# Patient Record
Sex: Female | Born: 1970 | Race: White | Hispanic: No | Marital: Married | Smoking: Never smoker
Health system: Southern US, Community
[De-identification: ages and names within clinical notes are randomized; demographics above are authoritative.]

## PROBLEM LIST (undated history)

## (undated) DIAGNOSIS — G4700 Insomnia, unspecified: Secondary | ICD-10-CM

## (undated) DIAGNOSIS — H53003 Unspecified amblyopia, bilateral: Secondary | ICD-10-CM

## (undated) DIAGNOSIS — R011 Cardiac murmur, unspecified: Secondary | ICD-10-CM

## (undated) DIAGNOSIS — Q231 Congenital insufficiency of aortic valve: Secondary | ICD-10-CM

## (undated) DIAGNOSIS — N979 Female infertility, unspecified: Secondary | ICD-10-CM

## (undated) DIAGNOSIS — F419 Anxiety disorder, unspecified: Secondary | ICD-10-CM

## (undated) DIAGNOSIS — R002 Palpitations: Secondary | ICD-10-CM

## (undated) DIAGNOSIS — Z5181 Encounter for therapeutic drug level monitoring: Secondary | ICD-10-CM

## (undated) HISTORY — DX: Anxiety disorder, unspecified: F41.9

## (undated) HISTORY — DX: Congenital insufficiency of aortic valve: Q23.1

## (undated) HISTORY — DX: Insomnia, unspecified: G47.00

## (undated) HISTORY — DX: Palpitations: R00.2

## (undated) HISTORY — DX: Cardiac murmur, unspecified: R01.1

## (undated) HISTORY — DX: Encounter for therapeutic drug level monitoring: Z51.81

## (undated) HISTORY — DX: Female infertility, unspecified: N97.9

## (undated) HISTORY — DX: Unspecified amblyopia, bilateral: H53.003

---
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
## 1990-12-31 HISTORY — PX: WISDOM TOOTH EXTRACTION: SHX21

## 1998-10-26 ENCOUNTER — Encounter: Payer: Self-pay | Admitting: Obstetrics and Gynecology

## 1998-10-26 ENCOUNTER — Ambulatory Visit (HOSPITAL_COMMUNITY): Admission: RE | Admit: 1998-10-26 | Discharge: 1998-10-26 | Payer: Self-pay

## 1999-04-27 ENCOUNTER — Inpatient Hospital Stay (HOSPITAL_COMMUNITY): Admission: EM | Admit: 1999-04-27 | Discharge: 1999-05-01 | Payer: Self-pay | Admitting: Psychiatry

## 1999-05-02 ENCOUNTER — Encounter (HOSPITAL_COMMUNITY): Admission: RE | Admit: 1999-05-02 | Discharge: 1999-05-12 | Payer: Self-pay | Admitting: Psychiatry

## 2000-06-12 ENCOUNTER — Other Ambulatory Visit: Admission: RE | Admit: 2000-06-12 | Discharge: 2000-06-12 | Payer: Self-pay | Admitting: Obstetrics and Gynecology

## 2000-07-23 ENCOUNTER — Encounter: Admission: RE | Admit: 2000-07-23 | Discharge: 2000-10-21 | Payer: Self-pay | Admitting: *Deleted

## 2002-03-16 ENCOUNTER — Ambulatory Visit (HOSPITAL_COMMUNITY): Admission: RE | Admit: 2002-03-16 | Discharge: 2002-03-16 | Payer: Self-pay | Admitting: Internal Medicine

## 2002-03-16 ENCOUNTER — Encounter: Payer: Self-pay | Admitting: Internal Medicine

## 2005-11-19 ENCOUNTER — Other Ambulatory Visit: Admission: RE | Admit: 2005-11-19 | Discharge: 2005-11-19 | Payer: Self-pay | Admitting: Obstetrics and Gynecology

## 2010-01-01 ENCOUNTER — Emergency Department (HOSPITAL_COMMUNITY): Admission: EM | Admit: 2010-01-01 | Discharge: 2010-01-01 | Payer: Self-pay | Admitting: Family Medicine

## 2010-01-01 ENCOUNTER — Emergency Department (HOSPITAL_COMMUNITY): Admission: EM | Admit: 2010-01-01 | Discharge: 2010-01-01 | Payer: Self-pay | Admitting: Emergency Medicine

## 2010-05-15 IMAGING — CR DG CHEST 2V
2 series · 2 of 2 positions shown · non-contrast
Comparison: None

CLINICAL DATA: Right-sided posterior chest pain for 2 days.  No
injury.

CHEST - 2 VIEW

[view not recorded (1 of 2)]
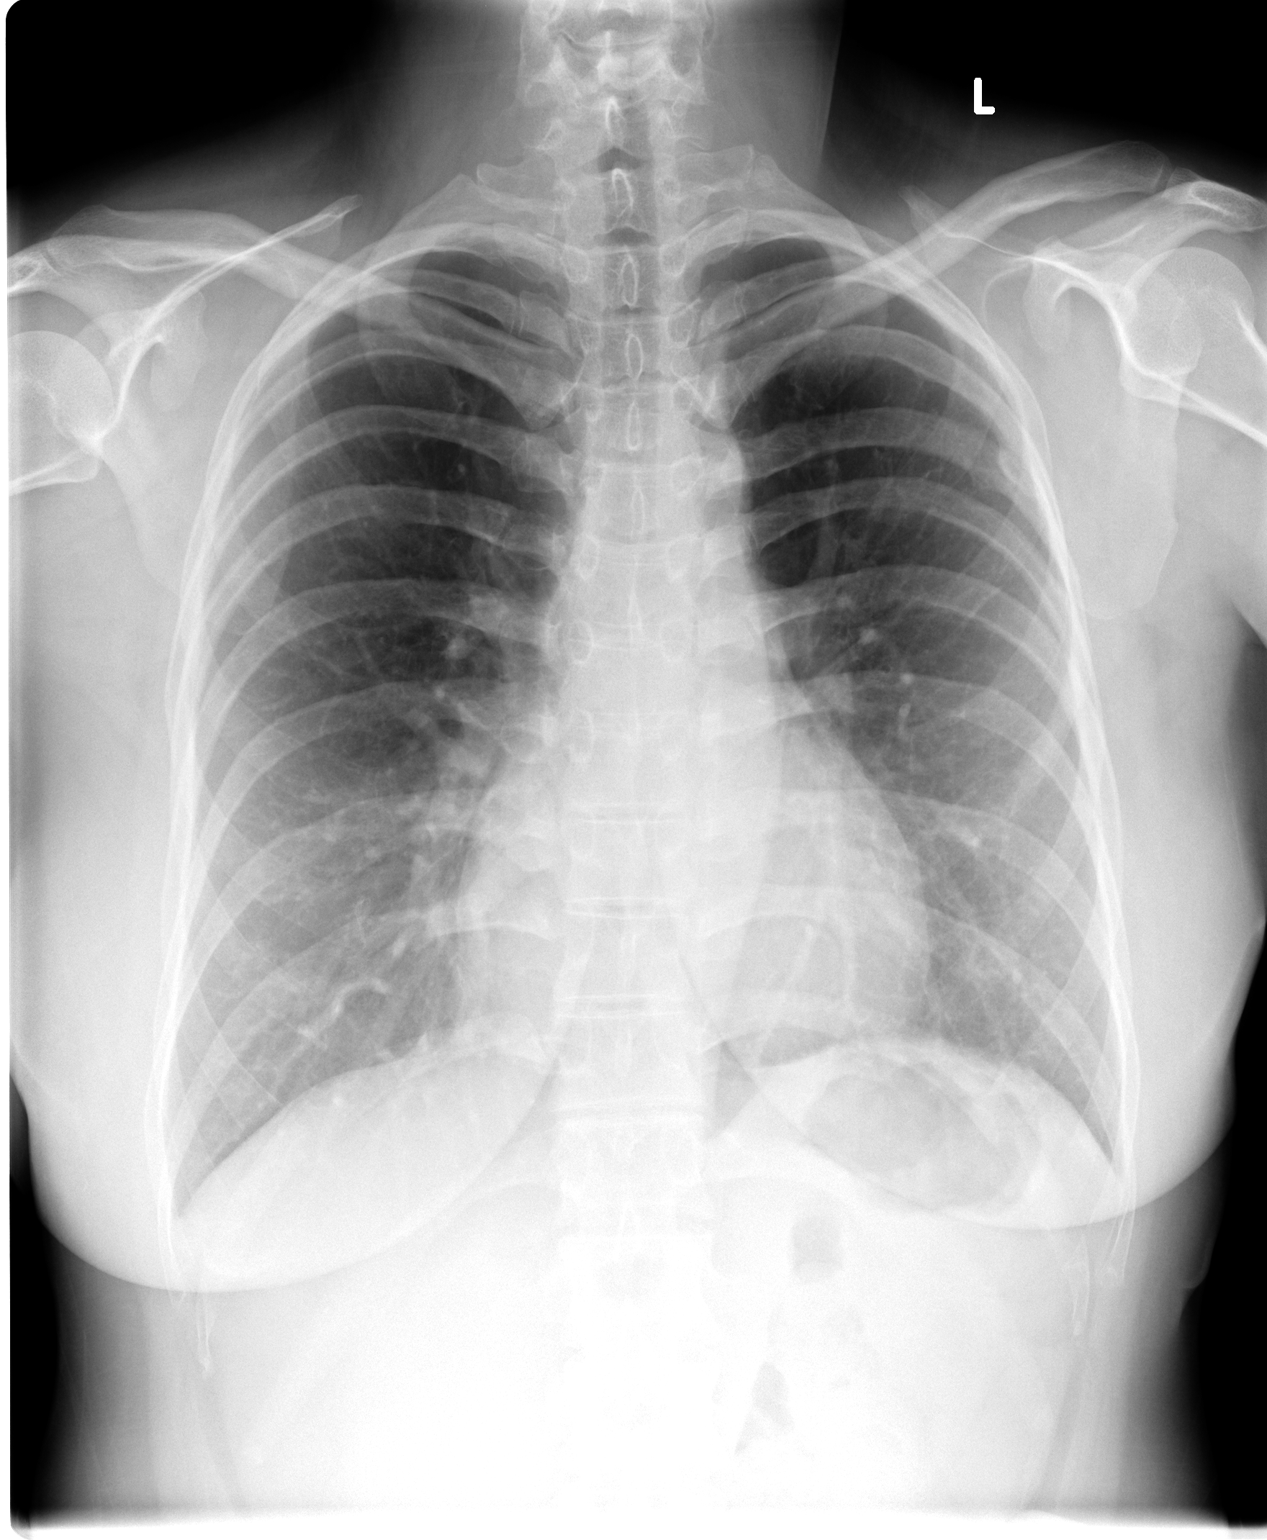

[view not recorded (2 of 2)]
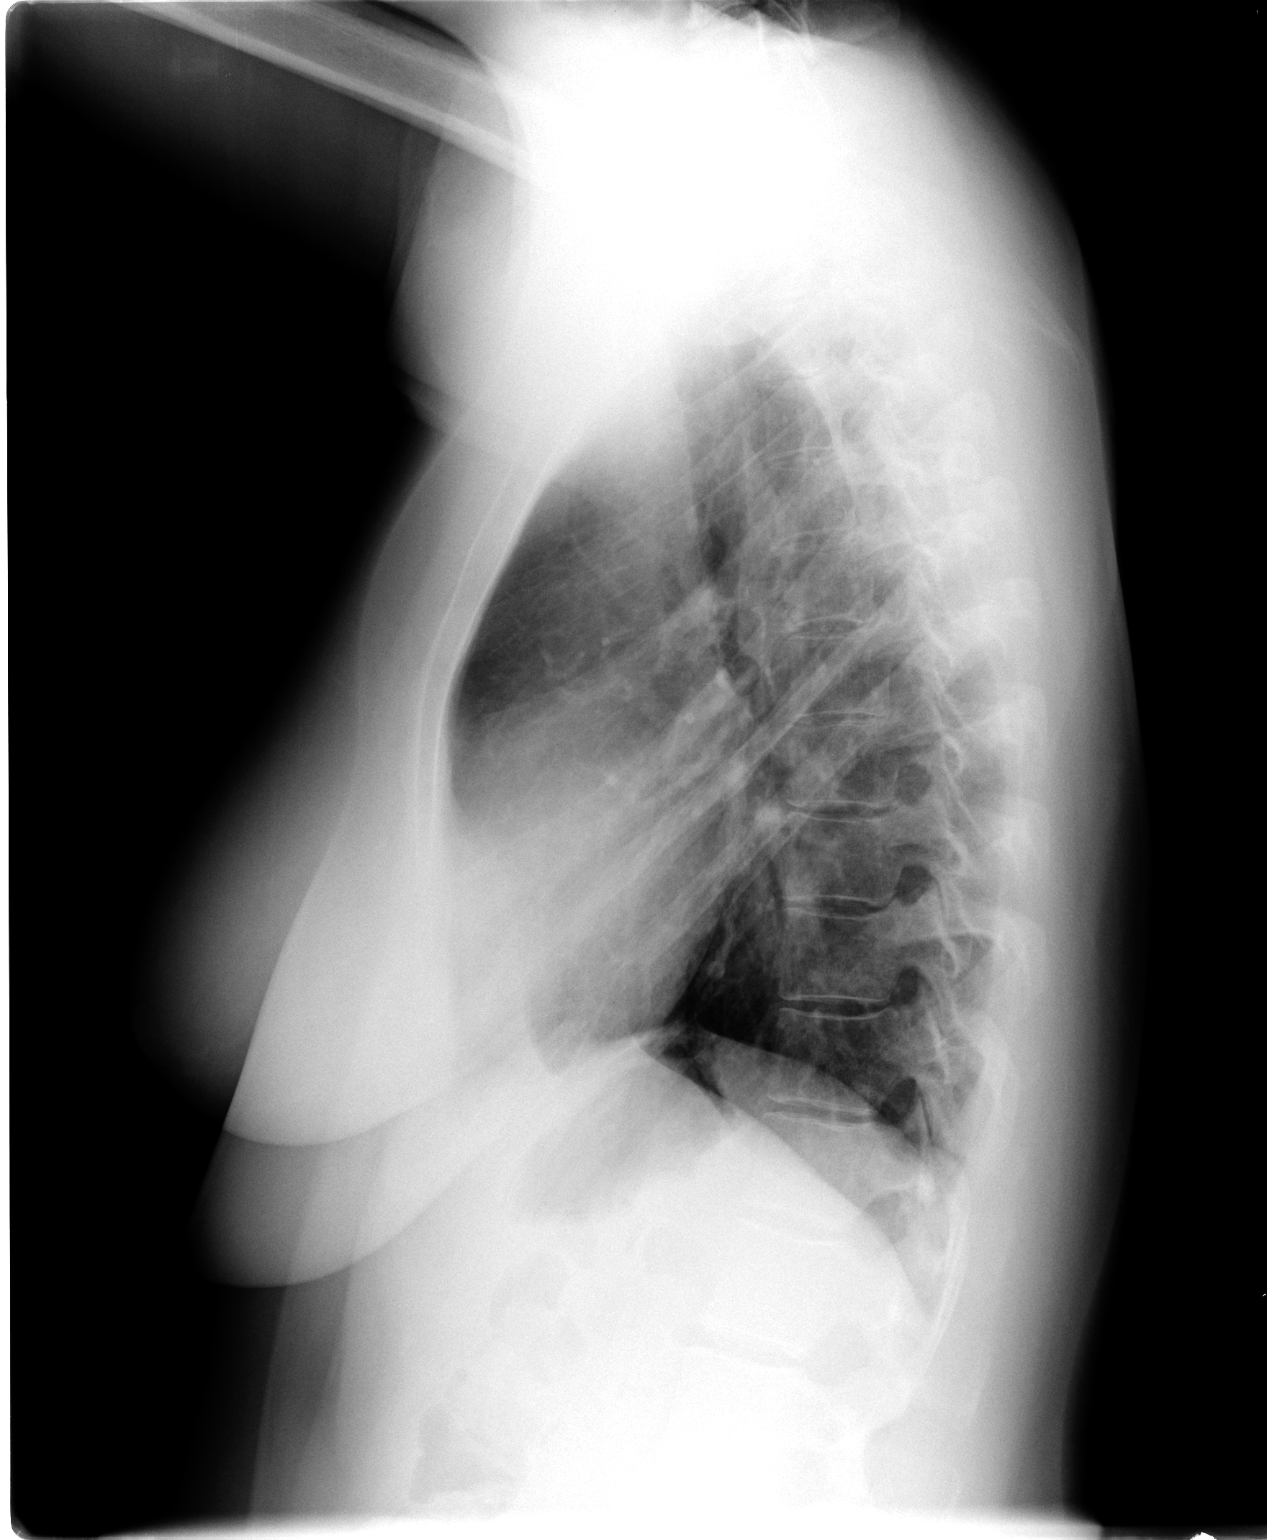

[2 of 2 positions shown; findings below may reference images not displayed]

FINDINGS: Mild pectus excavatum deformity. Midline trachea.  Normal
heart size and mediastinal contours. No pleural effusion or
pneumothorax.  Remote left-sided rib trauma at the fourth and
likely 5th posterolateral ribs.
IMPRESSION: No acute cardiopulmonary disease.

## 2011-03-18 LAB — POCT CARDIAC MARKERS
CKMB, poc: <1 ng/mL — ABNORMAL LOW (ref 1.0–8.0)
Troponin i, poc: <0.05 ng/mL (ref 0.00–0.09)

## 2011-03-18 LAB — URINALYSIS, ROUTINE W REFLEX MICROSCOPIC
Glucose, UA: NEGATIVE mg/dL
Hgb urine dipstick: NEGATIVE
Ketones, ur: 40 mg/dL — AB
Nitrite: NEGATIVE
Protein, ur: NEGATIVE mg/dL
Specific Gravity, Urine: 1.025 (ref 1.005–1.030)
Urobilinogen, UA: 1 mg/dL (ref 0.0–1.0)
pH: 5.5 (ref 5.0–8.0)

## 2011-03-18 LAB — POCT I-STAT, CHEM 8
Calcium, Ion: 1.16 mmol/L (ref 1.12–1.32)
Chloride: 105 mEq/L (ref 96–112)
Creatinine, Ser: 0.5 mg/dL (ref 0.4–1.2)
Glucose, Bld: 88 mg/dL (ref 70–99)
HCT: 42 % (ref 36.0–46.0)
Potassium: 3.5 mEq/L (ref 3.5–5.1)

## 2011-03-18 LAB — D-DIMER, QUANTITATIVE: D-Dimer, Quant: <0.22 ug/mL-FEU (ref 0.00–0.48)

## 2014-09-21 ENCOUNTER — Ambulatory Visit: Payer: Self-pay

## 2014-10-04 ENCOUNTER — Ambulatory Visit: Payer: Self-pay | Attending: Orthopaedic Surgery

## 2016-05-21 DIAGNOSIS — Z6828 Body mass index (BMI) 28.0-28.9, adult: Secondary | ICD-10-CM | POA: Diagnosis not present

## 2016-05-21 DIAGNOSIS — W57XXXA Bitten or stung by nonvenomous insect and other nonvenomous arthropods, initial encounter: Secondary | ICD-10-CM | POA: Diagnosis not present

## 2016-05-21 DIAGNOSIS — H60502 Unspecified acute noninfective otitis externa, left ear: Secondary | ICD-10-CM | POA: Diagnosis not present

## 2016-06-11 DIAGNOSIS — F4323 Adjustment disorder with mixed anxiety and depressed mood: Secondary | ICD-10-CM | POA: Diagnosis not present

## 2016-07-13 DIAGNOSIS — Z6827 Body mass index (BMI) 27.0-27.9, adult: Secondary | ICD-10-CM | POA: Diagnosis not present

## 2016-07-13 DIAGNOSIS — H00016 Hordeolum externum left eye, unspecified eyelid: Secondary | ICD-10-CM | POA: Diagnosis not present

## 2016-07-13 DIAGNOSIS — H9193 Unspecified hearing loss, bilateral: Secondary | ICD-10-CM | POA: Diagnosis not present

## 2016-08-07 DIAGNOSIS — H93293 Other abnormal auditory perceptions, bilateral: Secondary | ICD-10-CM | POA: Diagnosis not present

## 2016-10-17 DIAGNOSIS — Z6827 Body mass index (BMI) 27.0-27.9, adult: Secondary | ICD-10-CM | POA: Diagnosis not present

## 2016-10-17 DIAGNOSIS — M2550 Pain in unspecified joint: Secondary | ICD-10-CM | POA: Diagnosis not present

## 2016-11-29 DIAGNOSIS — F4323 Adjustment disorder with mixed anxiety and depressed mood: Secondary | ICD-10-CM | POA: Diagnosis not present

## 2017-01-10 DIAGNOSIS — Z6828 Body mass index (BMI) 28.0-28.9, adult: Secondary | ICD-10-CM | POA: Diagnosis not present

## 2017-01-10 DIAGNOSIS — R05 Cough: Secondary | ICD-10-CM | POA: Diagnosis not present

## 2017-01-10 DIAGNOSIS — J09X2 Influenza due to identified novel influenza A virus with other respiratory manifestations: Secondary | ICD-10-CM | POA: Diagnosis not present

## 2017-01-10 DIAGNOSIS — R6883 Chills (without fever): Secondary | ICD-10-CM | POA: Diagnosis not present

## 2017-03-08 DIAGNOSIS — F4323 Adjustment disorder with mixed anxiety and depressed mood: Secondary | ICD-10-CM | POA: Diagnosis not present

## 2017-05-08 DIAGNOSIS — H6692 Otitis media, unspecified, left ear: Secondary | ICD-10-CM | POA: Diagnosis not present

## 2017-05-08 DIAGNOSIS — Z6828 Body mass index (BMI) 28.0-28.9, adult: Secondary | ICD-10-CM | POA: Diagnosis not present

## 2017-05-08 DIAGNOSIS — L258 Unspecified contact dermatitis due to other agents: Secondary | ICD-10-CM | POA: Diagnosis not present

## 2017-06-28 DIAGNOSIS — Z1231 Encounter for screening mammogram for malignant neoplasm of breast: Secondary | ICD-10-CM | POA: Diagnosis not present

## 2017-06-28 DIAGNOSIS — Z01419 Encounter for gynecological examination (general) (routine) without abnormal findings: Secondary | ICD-10-CM | POA: Diagnosis not present

## 2017-06-28 DIAGNOSIS — Z6829 Body mass index (BMI) 29.0-29.9, adult: Secondary | ICD-10-CM | POA: Diagnosis not present

## 2017-06-28 DIAGNOSIS — Z0000 Encounter for general adult medical examination without abnormal findings: Secondary | ICD-10-CM | POA: Diagnosis not present

## 2017-07-05 DIAGNOSIS — M791 Myalgia: Secondary | ICD-10-CM | POA: Diagnosis not present

## 2017-07-05 DIAGNOSIS — Z0000 Encounter for general adult medical examination without abnormal findings: Secondary | ICD-10-CM | POA: Diagnosis not present

## 2017-07-05 DIAGNOSIS — G5600 Carpal tunnel syndrome, unspecified upper limb: Secondary | ICD-10-CM | POA: Diagnosis not present

## 2017-07-05 DIAGNOSIS — M79673 Pain in unspecified foot: Secondary | ICD-10-CM | POA: Diagnosis not present

## 2017-07-05 DIAGNOSIS — Q231 Congenital insufficiency of aortic valve: Secondary | ICD-10-CM | POA: Diagnosis not present

## 2017-07-05 DIAGNOSIS — Z1389 Encounter for screening for other disorder: Secondary | ICD-10-CM | POA: Diagnosis not present

## 2018-01-16 DIAGNOSIS — J0100 Acute maxillary sinusitis, unspecified: Secondary | ICD-10-CM | POA: Diagnosis not present

## 2018-01-16 DIAGNOSIS — J029 Acute pharyngitis, unspecified: Secondary | ICD-10-CM | POA: Diagnosis not present

## 2018-01-16 DIAGNOSIS — B349 Viral infection, unspecified: Secondary | ICD-10-CM | POA: Diagnosis not present

## 2018-03-11 DIAGNOSIS — J0100 Acute maxillary sinusitis, unspecified: Secondary | ICD-10-CM | POA: Diagnosis not present

## 2018-05-01 DIAGNOSIS — M79672 Pain in left foot: Secondary | ICD-10-CM | POA: Diagnosis not present

## 2018-05-01 DIAGNOSIS — Z6829 Body mass index (BMI) 29.0-29.9, adult: Secondary | ICD-10-CM | POA: Diagnosis not present

## 2018-07-23 DIAGNOSIS — Z6829 Body mass index (BMI) 29.0-29.9, adult: Secondary | ICD-10-CM | POA: Diagnosis not present

## 2018-07-23 DIAGNOSIS — Z1231 Encounter for screening mammogram for malignant neoplasm of breast: Secondary | ICD-10-CM | POA: Diagnosis not present

## 2018-07-23 DIAGNOSIS — Z01419 Encounter for gynecological examination (general) (routine) without abnormal findings: Secondary | ICD-10-CM | POA: Diagnosis not present

## 2018-10-10 ENCOUNTER — Other Ambulatory Visit (HOSPITAL_COMMUNITY): Payer: Self-pay | Admitting: Internal Medicine

## 2018-10-10 DIAGNOSIS — Q231 Congenital insufficiency of aortic valve: Secondary | ICD-10-CM

## 2018-10-10 DIAGNOSIS — M79672 Pain in left foot: Secondary | ICD-10-CM | POA: Diagnosis not present

## 2018-10-10 DIAGNOSIS — Z0000 Encounter for general adult medical examination without abnormal findings: Secondary | ICD-10-CM | POA: Diagnosis not present

## 2018-10-10 DIAGNOSIS — Z1389 Encounter for screening for other disorder: Secondary | ICD-10-CM | POA: Diagnosis not present

## 2018-10-23 ENCOUNTER — Other Ambulatory Visit: Payer: Self-pay

## 2018-10-23 ENCOUNTER — Ambulatory Visit (HOSPITAL_COMMUNITY): Payer: BLUE CROSS/BLUE SHIELD | Attending: Internal Medicine

## 2018-10-23 DIAGNOSIS — Q231 Congenital insufficiency of aortic valve: Secondary | ICD-10-CM

## 2019-09-04 DIAGNOSIS — Z01419 Encounter for gynecological examination (general) (routine) without abnormal findings: Secondary | ICD-10-CM | POA: Diagnosis not present

## 2019-09-04 DIAGNOSIS — Z6830 Body mass index (BMI) 30.0-30.9, adult: Secondary | ICD-10-CM | POA: Diagnosis not present

## 2019-09-04 DIAGNOSIS — Z1231 Encounter for screening mammogram for malignant neoplasm of breast: Secondary | ICD-10-CM | POA: Diagnosis not present

## 2019-09-04 DIAGNOSIS — N841 Polyp of cervix uteri: Secondary | ICD-10-CM | POA: Diagnosis not present

## 2019-09-04 DIAGNOSIS — Z309 Encounter for contraceptive management, unspecified: Secondary | ICD-10-CM | POA: Diagnosis not present

## 2019-12-02 DIAGNOSIS — S90861A Insect bite (nonvenomous), right foot, initial encounter: Secondary | ICD-10-CM | POA: Diagnosis not present

## 2019-12-02 DIAGNOSIS — L988 Other specified disorders of the skin and subcutaneous tissue: Secondary | ICD-10-CM | POA: Diagnosis not present

## 2020-01-29 DIAGNOSIS — J019 Acute sinusitis, unspecified: Secondary | ICD-10-CM | POA: Diagnosis not present

## 2020-01-29 DIAGNOSIS — R509 Fever, unspecified: Secondary | ICD-10-CM | POA: Diagnosis not present

## 2020-01-29 DIAGNOSIS — J3489 Other specified disorders of nose and nasal sinuses: Secondary | ICD-10-CM | POA: Diagnosis not present

## 2020-01-29 DIAGNOSIS — Z1152 Encounter for screening for COVID-19: Secondary | ICD-10-CM | POA: Diagnosis not present

## 2020-02-17 DIAGNOSIS — L820 Inflamed seborrheic keratosis: Secondary | ICD-10-CM | POA: Diagnosis not present

## 2020-02-17 DIAGNOSIS — L821 Other seborrheic keratosis: Secondary | ICD-10-CM | POA: Diagnosis not present

## 2020-02-17 DIAGNOSIS — D2262 Melanocytic nevi of left upper limb, including shoulder: Secondary | ICD-10-CM | POA: Diagnosis not present

## 2020-02-17 DIAGNOSIS — D225 Melanocytic nevi of trunk: Secondary | ICD-10-CM | POA: Diagnosis not present

## 2020-03-05 ENCOUNTER — Ambulatory Visit: Payer: BC Managed Care – PPO | Attending: Internal Medicine

## 2020-07-22 DIAGNOSIS — Z0000 Encounter for general adult medical examination without abnormal findings: Secondary | ICD-10-CM | POA: Diagnosis not present

## 2020-07-22 DIAGNOSIS — Z79899 Other long term (current) drug therapy: Secondary | ICD-10-CM | POA: Diagnosis not present

## 2020-08-02 DIAGNOSIS — Z0000 Encounter for general adult medical examination without abnormal findings: Secondary | ICD-10-CM | POA: Diagnosis not present

## 2020-08-02 DIAGNOSIS — R82998 Other abnormal findings in urine: Secondary | ICD-10-CM | POA: Diagnosis not present

## 2020-08-02 DIAGNOSIS — Q231 Congenital insufficiency of aortic valve: Secondary | ICD-10-CM | POA: Diagnosis not present

## 2020-08-02 DIAGNOSIS — E669 Obesity, unspecified: Secondary | ICD-10-CM | POA: Diagnosis not present

## 2020-08-04 DIAGNOSIS — Z1212 Encounter for screening for malignant neoplasm of rectum: Secondary | ICD-10-CM | POA: Diagnosis not present

## 2020-11-11 DIAGNOSIS — R350 Frequency of micturition: Secondary | ICD-10-CM | POA: Diagnosis not present

## 2020-12-13 DIAGNOSIS — Z6830 Body mass index (BMI) 30.0-30.9, adult: Secondary | ICD-10-CM | POA: Diagnosis not present

## 2020-12-13 DIAGNOSIS — Z1231 Encounter for screening mammogram for malignant neoplasm of breast: Secondary | ICD-10-CM | POA: Diagnosis not present

## 2020-12-13 DIAGNOSIS — Z01419 Encounter for gynecological examination (general) (routine) without abnormal findings: Secondary | ICD-10-CM | POA: Diagnosis not present

## 2021-04-19 DIAGNOSIS — Z23 Encounter for immunization: Secondary | ICD-10-CM | POA: Diagnosis not present

## 2021-06-16 DIAGNOSIS — D692 Other nonthrombocytopenic purpura: Secondary | ICD-10-CM | POA: Diagnosis not present

## 2021-06-16 DIAGNOSIS — R0602 Shortness of breath: Secondary | ICD-10-CM | POA: Diagnosis not present

## 2021-08-11 DIAGNOSIS — E669 Obesity, unspecified: Secondary | ICD-10-CM | POA: Diagnosis not present

## 2021-08-11 DIAGNOSIS — Z0000 Encounter for general adult medical examination without abnormal findings: Secondary | ICD-10-CM | POA: Diagnosis not present

## 2021-08-17 DIAGNOSIS — R7301 Impaired fasting glucose: Secondary | ICD-10-CM | POA: Diagnosis not present

## 2021-08-17 DIAGNOSIS — Z1389 Encounter for screening for other disorder: Secondary | ICD-10-CM | POA: Diagnosis not present

## 2021-08-17 DIAGNOSIS — R82998 Other abnormal findings in urine: Secondary | ICD-10-CM | POA: Diagnosis not present

## 2021-08-17 DIAGNOSIS — Z0000 Encounter for general adult medical examination without abnormal findings: Secondary | ICD-10-CM | POA: Diagnosis not present

## 2021-08-17 DIAGNOSIS — Z1331 Encounter for screening for depression: Secondary | ICD-10-CM | POA: Diagnosis not present

## 2021-11-22 ENCOUNTER — Encounter: Payer: Self-pay | Admitting: Gastroenterology

## 2021-12-31 HISTORY — PX: OTHER SURGICAL HISTORY: SHX169

## 2022-01-10 DIAGNOSIS — H168 Other keratitis: Secondary | ICD-10-CM | POA: Diagnosis not present

## 2022-01-11 ENCOUNTER — Other Ambulatory Visit: Payer: Self-pay

## 2022-01-11 ENCOUNTER — Ambulatory Visit (AMBULATORY_SURGERY_CENTER): Payer: BC Managed Care – PPO

## 2022-01-11 VITALS — Ht 60.0 in | Wt 165.0 lb

## 2022-01-11 DIAGNOSIS — Z1211 Encounter for screening for malignant neoplasm of colon: Secondary | ICD-10-CM

## 2022-01-11 MED ORDER — PLENVU 140 G PO SOLR: 1.0000 | ORAL | 0 refills | Status: DC

## 2022-01-11 NOTE — Progress Notes (Signed)
No egg or soy allergy known to patient  No issues known to pt with past sedation with any surgeries or procedures Patient denies ever being told they had issues or difficulty with intubation  No FH of Malignant Hyperthermia Pt is not on diet pills Pt is not on home 02  Pt is not on blood thinners  Pt denies issues with constipation at this time "not very often"  No A fib or A flutter Pt is fully vaccinated for Covid x 2; Coupon to pt in PV today, Code to Pharmacy and NO PA's for preps discussed with pt in PV today  Discussed with pt there will be an out-of-pocket cost for prep and that varies from $0 to 70 + dollars - pt verbalized understanding  Due to the COVID-19 pandemic we are asking patients to follow certain guidelines in PV and the [facility]   Pt aware of COVID protocols and [facility] guidelines  PV completed over the phone. Pt verified name, DOB, address and insurance during PV today.  Pt mailed instruction packet with copy of consent form to read and not return, and instructions.  Pt encouraged to call with questions or issues.  If pt has My chart, procedure instructions sent via My Chart   Patient given a choice between suprep and plenvu preps- patient chose plenvu and understands this will be an out of pocket expense even with the coupon;

## 2022-01-17 DIAGNOSIS — H168 Other keratitis: Secondary | ICD-10-CM | POA: Diagnosis not present

## 2022-01-25 ENCOUNTER — Encounter: Payer: BC Managed Care – PPO | Admitting: Gastroenterology

## 2022-03-01 ENCOUNTER — Encounter: Payer: Self-pay | Admitting: Gastroenterology

## 2022-03-07 ENCOUNTER — Encounter: Payer: Self-pay | Admitting: Gastroenterology

## 2022-03-07 ENCOUNTER — Ambulatory Visit (AMBULATORY_SURGERY_CENTER): Payer: BC Managed Care – PPO | Admitting: Gastroenterology

## 2022-03-07 VITALS — BP 154/77 | HR 64 | Temp 97.1°F | Resp 14 | Ht 60.0 in | Wt 165.0 lb

## 2022-03-07 DIAGNOSIS — Z1211 Encounter for screening for malignant neoplasm of colon: Secondary | ICD-10-CM | POA: Diagnosis not present

## 2022-03-07 MED ORDER — SODIUM CHLORIDE 0.9 % IV SOLN: 500.0000 mL | Freq: Once | INTRAVENOUS | Status: DC

## 2022-03-07 NOTE — Progress Notes (Signed)
[facility] History and Physical ? ? ?Primary Care Physician:  Cleatis Polka., MD ? ? ?Reason for Procedure:  Colorectal cancer screening ? ?Plan:    Screening colonoscopy with possible interventions as needed ? ? ? ? ?HPI: Kyaira Trantham is a very pleasant 51 y.o. female here for screening colonoscopy. ?Denies any nausea, vomiting, abdominal pain, melena or bright red blood per rectum ? ?The risks and benefits as well as alternatives of endoscopic procedure(s) have been discussed and reviewed. All questions answered. The patient agrees to proceed. ? ? ? ?Past Medical History:  ?Diagnosis Date  ? Blood transfusion without reported diagnosis 1976  ? Heart murmur   ? as a child  ? ? ?Past Surgical History:  ?Procedure Laterality Date  ? CARDIAC SURGERY  1976  ? CESAREAN SECTION  1994  ? tooth removal  12/2021  ? WISDOM TOOTH EXTRACTION  1992  ? ? ?Prior to Admission medications   ?Medication Sig Start Date End Date Taking? Authorizing Provider  ?CHARLOTTE 24 FE 1-20 MG-MCG(24) CHEW Chew 1 tablet by mouth daily. 12/22/21  Yes [provider]  ?neomycin-polymyxin b-dexamethasone (MAXITROL) 3.5-10000-0.1 SUSP Place 1 drop into the left eye 3 (three) times daily. 01/10/22   [provider]  ? ? ?Current Outpatient Medications  ?Medication Sig Dispense Refill  ? CHARLOTTE 24 FE 1-20 MG-MCG(24) CHEW Chew 1 tablet by mouth daily.    ? neomycin-polymyxin b-dexamethasone (MAXITROL) 3.5-10000-0.1 SUSP Place 1 drop into the left eye 3 (three) times daily.    ? ?Current Facility-Administered Medications  ?Medication Dose Route Frequency Provider Last Rate Last Admin  ? 0.9 %  sodium chloride infusion  500 mL Intravenous Once Lotus Gover, Eleonore Chiquito, MD      ? ? ?Allergies as of 03/07/2022 - Review Complete 03/07/2022  ?Allergen Reaction Noted  ? Amoxicillin Other (See Comments) 01/11/2022  ? Amoxicillin-pot clavulanate Hives, Swelling, and Other (See Comments) 01/11/2022  ? Honey bee venom Swelling  01/11/2022  ? Pneumococcal vaccine Swelling 01/11/2022  ? Covid-19 (subunit) vaccine Rash 01/11/2022  ? Tape Swelling 03/07/2022  ? ? ?Family History  ?Problem Relation Age of Onset  ? Colon polyps Neg Hx   ? Colon cancer Neg Hx   ? Esophageal cancer Neg Hx   ? Stomach cancer Neg Hx   ? Rectal cancer Neg Hx   ? ? ?Social History  ? ?Socioeconomic History  ? Marital status: Married  ?  Spouse name: Not on file  ? Number of children: Not on file  ? Years of education: Not on file  ? Highest education level: Not on file  ?Occupational History  ? Not on file  ?Tobacco Use  ? Smoking status: Never  ? Smokeless tobacco: Never  ?Vaping Use  ? Vaping Use: Never used  ?Substance and Sexual Activity  ? Alcohol use: Not Currently  ?  Alcohol/week: 0.0 - 1.0 standard drinks  ? Drug use: Never  ? Sexual activity: Not on file  ?Other Topics Concern  ? Not on file  ?Social History Narrative  ? Not on file  ? ?Social Determinants of Health  ? ?Financial Resource Strain: Not on file  ?Food Insecurity: Not on file  ?Transportation Needs: Not on file  ?Physical Activity: Not on file  ?Stress: Not on file  ?Social Connections: Not on file  ?Intimate Partner Violence: Not on file  ? ? ?Review of Systems: ? ?All other review of systems negative except as mentioned in the HPI. ? ?Physical  Exam: ?Vital signs in last 24 hours: ?BP (!) 161/67   Pulse 70   Temp (!) 97.1 ?F (36.2 ?C)   Ht 5' (1.524 m)   Wt 165 lb (74.8 kg)   LMP 02/14/2022   SpO2 100%   BMI 32.22 kg/m?  ?General:   Alert, NAD ?Lungs:  Clear .   ?Heart:  Regular rate and rhythm ?Abdomen:  Soft, nontender and nondistended. ?Neuro/Psych:  Alert and cooperative. Normal mood and affect. A and O x 3 ? ?Reviewed labs, radiology imaging, old records and pertinent past GI work up ? ?Patient is appropriate for planned procedure(s) and anesthesia in an ambulatory setting ? ? ?K. Scherry Ran , MD ?[phone]  ? ? ?  ?

## 2022-03-07 NOTE — Progress Notes (Signed)
To pacu, VSS. Report to [email or web address] 

## 2022-03-07 NOTE — Patient Instructions (Signed)
Resme previous diet and present medications.  Repeat colonoscopy in 10 years for surveillance. ? ? ?YOU HAD AN ENDOSCOPIC PROCEDURE TODAY AT THE [facility]:   Refer to the procedure report that was given to you for any specific questions about what was found during the examination.  If the procedure report does not answer your questions, please call your gastroenterologist to clarify.  If you requested that your care partner not be given the details of your procedure findings, then the procedure report has been included in a sealed envelope for you to review at your convenience later. ? ?YOU SHOULD EXPECT: Some feelings of bloating in the abdomen. Passage of more gas than usual.  Walking can help get rid of the air that was put into your GI tract during the procedure and reduce the bloating. If you had a lower endoscopy (such as a colonoscopy or flexible sigmoidoscopy) you may notice spotting of blood in your stool or on the toilet paper. If you underwent a bowel prep for your procedure, you may not have a normal bowel movement for a few days. ? ?Please Note:  You might notice some irritation and congestion in your nose or some drainage.  This is from the oxygen used during your procedure.  There is no need for concern and it should clear up in a day or so. ? ?SYMPTOMS TO REPORT IMMEDIATELY: ? ?Following lower endoscopy (colonoscopy or flexible sigmoidoscopy): ? Excessive amounts of blood in the stool ? Significant tenderness or worsening of abdominal pains ? Swelling of the abdomen that is new, acute ? Fever of 100?F or higher ? ? ?For urgent or emergent issues, a gastroenterologist can be reached at any hour by calling [phone]. ?Do not use MyChart messaging for urgent concerns.  ? ? ?DIET:  We do recommend a small meal at first, but then you may proceed to your regular diet.  Drink plenty of fluids but you should avoid alcoholic beverages for 24 hours. ? ?ACTIVITY:  You should plan to take it  easy for the rest of today and you should NOT DRIVE or use heavy machinery until tomorrow (because of the sedation medicines used during the test).   ? ?FOLLOW UP: ?Our staff will call the number listed on your records 48-72 hours following your procedure to check on you and address any questions or concerns that you may have regarding the information given to you following your procedure. If we do not reach you, we will leave a message.  We will attempt to reach you two times.  During this call, we will ask if you have developed any symptoms of COVID 19. If you develop any symptoms (ie: fever, flu-like symptoms, shortness of breath, cough etc.) before then, please call [phone].  If you test positive for Covid 19 in the 2 weeks post procedure, please call and report this information to [place].   ? ?If any biopsies were taken you will be contacted by phone or by letter within the next 1-3 weeks.  Please call us at [phone] if you have not heard about the biopsies in 3 weeks.  ? ? ?SIGNATURES/CONFIDENTIALITY: ?You and/or your care partner have signed paperwork which will be entered into your electronic medical record.  These signatures attest to the fact that that the information above on your After Visit Summary has been reviewed and is understood.  Full responsibility of the confidentiality of this discharge information lies with you and/or your care-partner.  ?

## 2022-03-07 NOTE — Progress Notes (Signed)
Vs DT ? ?Pt's states no medical or surgical changes since previsit or office visit. ? ?

## 2022-03-07 NOTE — Op Note (Signed)
[facility] ?Patient Name: Janice Barker ?Procedure Date: 03/07/2022 10:35 AM ?MRN: [id number] ?Endoscopist: Napoleon Form , MD ?Age: 51 ?Referring MD:  ?Date of Birth: [birth year] ?Gender: Female ?Account #: [id number] ?Procedure:                Colonoscopy ?Indications:              Screening for colorectal malignant neoplasm ?Medicines:                Monitored Anesthesia Care ?Procedure:                Pre-Anesthesia Assessment: ?                          - Prior to the procedure, a History and Physical  ?                          was performed, and patient medications and  ?                          allergies were reviewed. The patient's tolerance of  ?                          previous anesthesia was also reviewed. The risks  ?                          and benefits of the procedure and the sedation  ?                          options and risks were discussed with the patient.  ?                          All questions were answered, and informed consent  ?                          was obtained. Prior Anticoagulants: The patient has  ?                          taken no previous anticoagulant or antiplatelet  ?                          agents. ASA Grade Assessment: II - A patient with  ?                          mild systemic disease. After reviewing the risks  ?                          and benefits, the patient was deemed in  ?                          satisfactory condition to undergo the procedure. ?                          After obtaining informed consent, the colonoscope  ?  was passed under direct vision. Throughout the  ?                          procedure, the patient's blood pressure, pulse, and  ?                          oxygen saturations were monitored continuously. The  ?                          Olympus [id number]-[id number] (#[id number]) Colonoscope was  ?                          introduced through the anus and advanced to the the  ?                          cecum,  identified by appendiceal orifice and  ?                          ileocecal valve. The colonoscopy was performed  ?                          without difficulty. The patient tolerated the  ?                          procedure well. The quality of the bowel  ?                          preparation was excellent. The ileocecal valve,  ?                          appendiceal orifice, and rectum were photographed. ?Scope In: 10:38:46 AM ?Scope Out: 10:53:38 AM ?Scope Withdrawal Time: 0 hours 7 minutes 50 seconds  ?Total Procedure Duration: 0 hours 14 minutes 52 seconds  ?Findings:                 The perianal and digital rectal examinations were  ?                          normal. ?                          A few small-mouthed diverticula were found in the  ?                          sigmoid colon. ?                          Non-bleeding external and internal hemorrhoids were  ?                          found during retroflexion. The hemorrhoids were  ?                          medium-sized. ?  The exam was otherwise without abnormality. ?Complications:            No immediate complications. ?Estimated Blood Loss:     Estimated blood loss was minimal. ?Impression:               - Diverticulosis in the sigmoid colon. ?                          - Non-bleeding external and internal hemorrhoids. ?                          - The examination was otherwise normal. ?                          - No specimens collected. ?Recommendation:           - Patient has a contact number available for  ?                          emergencies. The signs and symptoms of potential  ?                          delayed complications were discussed with the  ?                          patient. Return to normal activities tomorrow.  ?                          Written discharge instructions were provided to the  ?                          patient. ?                          - Resume previous diet. ?                          -  Continue present medications. ?                          - Repeat colonoscopy in 10 years for surveillance. ?Napoleon FormKavitha V. Stratton Villwock, MD ?03/07/2022 10:57:02 AM ?This report has been signed electronically. ?

## 2022-03-09 ENCOUNTER — Telehealth: Payer: Self-pay

## 2022-03-09 NOTE — Telephone Encounter (Signed)
?  Follow up Call- ? ?Call back number 03/07/2022  ?Post procedure Call Back phone  # [phone]  ?Permission to leave phone message Yes  ?Some recent data might be hidden  ?  ? ?Patient questions: ? ?Do you have a fever, pain , or abdominal swelling? No. ?Pain Score  0 * ? ?Have you tolerated food without any problems? Yes.   ? ?Have you been able to return to your normal activities? Yes.   ? ?Do you have any questions about your discharge instructions: ?Diet   No. ?Medications  No. ?Follow up visit  No. ? ?Do you have questions or concerns about your Care? No. ? ?Actions: ?* If pain score is 4 or above: ?No action needed, pain <4. ? ? ?

## 2022-03-27 DIAGNOSIS — R002 Palpitations: Secondary | ICD-10-CM | POA: Diagnosis not present

## 2022-03-27 DIAGNOSIS — Q231 Congenital insufficiency of aortic valve: Secondary | ICD-10-CM | POA: Diagnosis not present

## 2022-04-11 DIAGNOSIS — Z124 Encounter for screening for malignant neoplasm of cervix: Secondary | ICD-10-CM | POA: Diagnosis not present

## 2022-04-11 DIAGNOSIS — Z1231 Encounter for screening mammogram for malignant neoplasm of breast: Secondary | ICD-10-CM | POA: Diagnosis not present

## 2022-04-11 DIAGNOSIS — Z1151 Encounter for screening for human papillomavirus (HPV): Secondary | ICD-10-CM | POA: Diagnosis not present

## 2022-04-11 DIAGNOSIS — Z6830 Body mass index (BMI) 30.0-30.9, adult: Secondary | ICD-10-CM | POA: Diagnosis not present

## 2022-04-11 DIAGNOSIS — Z01419 Encounter for gynecological examination (general) (routine) without abnormal findings: Secondary | ICD-10-CM | POA: Diagnosis not present

## 2022-04-23 ENCOUNTER — Other Ambulatory Visit: Payer: Self-pay | Admitting: *Deleted

## 2022-04-23 DIAGNOSIS — R002 Palpitations: Secondary | ICD-10-CM

## 2022-05-07 ENCOUNTER — Ambulatory Visit (INDEPENDENT_AMBULATORY_CARE_PROVIDER_SITE_OTHER): Payer: BC Managed Care – PPO

## 2022-05-07 DIAGNOSIS — R002 Palpitations: Secondary | ICD-10-CM | POA: Diagnosis not present

## 2022-05-31 ENCOUNTER — Telehealth: Payer: Self-pay | Admitting: *Deleted

## 2022-05-31 NOTE — Telephone Encounter (Signed)
Call into triage.   10:44 am CST today first doc atrial fibrillation around 100 bpm  auto captured.  Event available on [employer] website.  Message to monitor techs to see if they can obtain the report for DOD to review.     Monitor ordered for palpitations by PCP.  She is not a [facility] patient.

## 2022-05-31 NOTE — Telephone Encounter (Signed)
Received cardiac report: day 25 critical from [employer].   Monitor ordered by Dr Clelia Croft for evaluation of palpitations Report states Atrial Fibrillation with HR of 100 bmp that was auto triggered.  Had DOD, Dr Eldridge Dace, review who states rhythm appears to be NSR with PACs but to continue to monitor for now.  No previous EKG for comparison.  If is determined to be At Fib, pt would more than likely need anticoagulation d/t structure heart history.

## 2022-07-29 ENCOUNTER — Encounter: Payer: Self-pay | Admitting: Cardiovascular Disease

## 2022-07-29 NOTE — Progress Notes (Unsigned)
Cardiology Office Note:    Date:  07/30/2022   ID:  Janice Barker, DOB 1971/07/16, MRN [id number]  PCP:  Cleatis Polka., MD   [facility] Providers Cardiologist:  Edin Kon     Referring MD: Cleatis Polka., MD   Chief Complaint  Patient presents with   bicuspid aortic valve    History of Present Illness:    Janice Barker is a 51 y.o. female with a hx of bicuspid Aotic valve, coarctation repair at age 35  and palpitations.  We were asked to see her by Dr. Clelia Croft for further evaluation and management of her bicuspid AV.   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[employer] )     Past Medical History:  Diagnosis Date   Anxiety    Bicuspid aortic valve    Blood transfusion without reported diagnosis 1976   Heart murmur    as a child   Infertility, female    Insomnia    Lazy eye, bilateral    Palpitations    SBE (subacute bacterial endocarditis) prophylaxis monitoring     Past Surgical History:  Procedure Laterality Date   CARDIAC SURGERY  1976   CESAREAN SECTION  1994   tooth removal  12/2021   WISDOM TOOTH EXTRACTION  1992    Current Medications: Current Meds  Medication Sig   ALPRAZolam (XANAX) 0.25 MG tablet Take 0.25 mg by mouth at bedtime as needed for anxiety.   amoxicillin (AMOXIL) 500 MG capsule TAKE 4 TABS 1 HOUR PRIOR TO PROCEDURE   CHARLOTTE 24 FE 1-20 MG-MCG(24) CHEW Chew 1 tablet by mouth daily.   Multiple Vitamins-Minerals (WOMENS MULTIVITAMIN PLUS PO) Take by mouth.     Allergies:   Amoxicillin-pot clavulanate, Honey bee venom, Pneumococcal vaccine,  Covid-19 (subunit) vaccine, and Tape   Social History   Socioeconomic History   Marital status: Married    Spouse name: Not on file   Number of children: Not on file   Years of education: Not on file   Highest education level: Not on file  Occupational History   Not on file  Tobacco Use   Smoking status: Never   Smokeless tobacco: Never  Vaping Use   Vaping Use: Never used  Substance and Sexual Activity   Alcohol use: Not Currently    Alcohol/week: 0.0 - 1.0 standard drinks of alcohol   Drug use: Never   Sexual activity: Not on file  Other Topics Concern   Not on file  Social History Narrative   Not on file   Social Determinants of Health   Financial Resource Strain: Not on file  Food Insecurity: Not on file  Transportation Needs: Not on file  Physical Activity: Not on file  Stress: Not on file  Social Connections: Not on file     Family History: The patient's family history includes Bipolar disorder in her sister; CVA in her paternal grandfather and paternal grandmother; Cancer - Lung in her maternal grandmother; Heart attack in her paternal grandfather; Hypertension in  her father; Hypothyroidism in her mother; Subarachnoid hemorrhage in her sister. There is no history of Colon polyps, Colon cancer, Esophageal cancer, Stomach cancer, or Rectal cancer.  ROS:   Please see the history of present illness.     All other systems reviewed and are negative.  EKGs/Labs/Other Studies Reviewed:    The following studies were reviewed today:   EKG: July 30, 2022: Sinus bradycardia 59.  Nonspecific ST and T wave changes. Recent Labs: No results found for requested labs within last 365 days.  Recent Lipid Panel No results found for: "CHOL", "TRIG", "HDL", "CHOLHDL", "VLDL", "LDLCALC", "LDLDIRECT"   Risk Assessment/Calculations:             Physical Exam:    VS:  BP 110/70 (BP Location: Left Arm, Patient Position: Sitting, Cuff Size: Normal)   Pulse (!) 59   Resp  20   Ht 5\' 1"  (1.549 m)   Wt 157 lb 6.4 oz (71.4 kg)   LMP 07/08/2022 (Approximate)   SpO2 97%   BMI 29.74 kg/m     Wt Readings from Last 3 Encounters:  07/30/22 157 lb 6.4 oz (71.4 kg)  03/07/22 165 lb (74.8 kg)  01/11/22 165 lb (74.8 kg)     GEN:  Well nourished, well developed in no acute distress HEENT: Normal NECK: No JVD; No carotid bruits LYMPHATICS: No lymphadenopathy CARDIAC: RRR,  soft systolic murmur at ULSB.    Bilateral radial pulses are brisk and symetric,  dorsalis pedis pulses are brisk and symetric.  RESPIRATORY:  Clear to auscultation without rales, wheezing or rhonchi  ABDOMEN: Soft, non-tender, non-distended MUSCULOSKELETAL:  No edema; No deformity  SKIN: Warm and dry NEUROLOGIC:  Alert and oriented x 3 PSYCHIATRIC:  Normal affect   ASSESSMENT:    1. Palpitations   2. Coarctation of aorta   3. Bicuspid aortic valve    PLAN:    In order of problems listed above:  Palpitations: Janice Barker presents with some palpitations.  Event monitor showed very brief episodes of possible paroxysmal atrial fibrillation versus junctional rhythm.  She is not having any significant palpitations now.  These do not last for any length of time.  She does not need anticoagulation for these at this point.  Her CHA2DS2-VASc score is 0.  2.  History of coarctation of the aorta-status postrepair.  She had repair at age 62.  They used porcine material.  Because of this I do want to give her SBE prophylaxis prior to having dental work.  She has several dental procedures coming up in the next several months.  I would like to get an MR angiogram of her aorta to evaluate her entire aorta.  3.  History of bicuspid aortic valve: She does have a soft murmur.  I do not hear any diastolic murmur.  We have an echocardiogram from 2019 but it was not adequate to determine the number of cusps.  We discussed that she may need a transesophageal echo at some point but I do not think there is any compelling  need to do it right away.           Medication Adjustments/Labs and Tests Ordered: Current medicines are reviewed at length with the patient today.  Concerns regarding medicines are outlined above.  Orders Placed This Encounter  Procedures   MR Angiogram Chest W Wo Contrast   EKG 12-Lead   ECHOCARDIOGRAM COMPLETE   Meds ordered this encounter  Medications   amoxicillin (AMOXIL) 500 MG capsule  Sig: TAKE 4 TABS 1 HOUR PRIOR TO PROCEDURE    Dispense:  20 capsule    Refill:  0    Patient Instructions  Medication Instructions:  NO CHANGES *If you need a refill on your cardiac medications before your next appointment, please call your pharmacy*   Lab Work: NONE If you have labs (blood work) drawn today and your tests are completely normal, you will receive your results only by: MyChart Message (if you have MyChart) OR A paper copy in the mail If you have any lab test that is abnormal or we need to change your treatment, we will call you to review the results.   Testing/Procedures: Your physician has requested that you have an echocardiogram. Echocardiography is a painless test that uses sound waves to create images of your heart. It provides your doctor with information about the size and shape of your heart and how well your heart's chambers and valves are working. This procedure takes approximately one hour. There are no restrictions for this procedure.  CARDIAC MRA    Follow-Up: At [facility], you and your health needs are our priority.  As part of our continuing mission to provide you with exceptional heart care, we have created designated Provider Care Teams.  These Care Teams include your primary Cardiologist (physician) and Advanced Practice Providers (APPs -  Physician Assistants and Nurse Practitioners) who all work together to provide you with the care you need, when you need it.  We recommend signing up for the patient portal called "MyChart".  Sign up  information is provided on this After Visit Summary.  MyChart is used to connect with patients for Virtual Visits (Telemedicine).  Patients are able to view lab/test results, encounter notes, upcoming appointments, etc.  Non-urgent messages can be sent to your provider as well.   To learn more about what you can do with MyChart, go to [email or web address].    Your next appointment:   1 year(s)  The format for your next appointment:   In Person  Provider:   DR [facility]     Other Instructions NONE   Important Information About Sugar         Signed, Kristeen Miss, MD  07/30/2022 5:29 PM    [facility]

## 2022-07-30 ENCOUNTER — Encounter: Payer: Self-pay | Admitting: Cardiovascular Disease

## 2022-07-30 ENCOUNTER — Ambulatory Visit: Payer: BC Managed Care – PPO | Admitting: Cardiovascular Disease

## 2022-07-30 VITALS — BP 110/70 | HR 59 | Resp 20 | Ht 61.0 in | Wt 157.4 lb

## 2022-07-30 DIAGNOSIS — Q231 Congenital insufficiency of aortic valve: Secondary | ICD-10-CM

## 2022-07-30 DIAGNOSIS — R002 Palpitations: Secondary | ICD-10-CM | POA: Diagnosis not present

## 2022-07-30 DIAGNOSIS — Q251 Coarctation of aorta: Secondary | ICD-10-CM | POA: Diagnosis not present

## 2022-07-30 MED ORDER — AMOXICILLIN 500 MG PO CAPS: ORAL_CAPSULE | ORAL | 0 refills | Status: DC

## 2022-07-30 NOTE — Patient Instructions (Signed)
Medication Instructions:  NO CHANGES *If you need a refill on your cardiac medications before your next appointment, please call your pharmacy*   Lab Work: NONE If you have labs (blood work) drawn today and your tests are completely normal, you will receive your results only by: MyChart Message (if you have MyChart) OR A paper copy in the mail If you have any lab test that is abnormal or we need to change your treatment, we will call you to review the results.   Testing/Procedures: Your physician has requested that you have an echocardiogram. Echocardiography is a painless test that uses sound waves to create images of your heart. It provides your doctor with information about the size and shape of your heart and how well your heart's chambers and valves are working. This procedure takes approximately one hour. There are no restrictions for this procedure.  CARDIAC MRA    Follow-Up: At [facility], you and your health needs are our priority.  As part of our continuing mission to provide you with exceptional heart care, we have created designated Provider Care Teams.  These Care Teams include your primary Cardiologist (physician) and Advanced Practice Providers (APPs -  Physician Assistants and Nurse Practitioners) who all work together to provide you with the care you need, when you need it.  We recommend signing up for the patient portal called "MyChart".  Sign up information is provided on this After Visit Summary.  MyChart is used to connect with patients for Virtual Visits (Telemedicine).  Patients are able to view lab/test results, encounter notes, upcoming appointments, etc.  Non-urgent messages can be sent to your provider as well.   To learn more about what you can do with MyChart, go to [email or web address].    Your next appointment:   1 year(s)  The format for your next appointment:   In Person  Provider:   DR Elease Hashimoto     Other Instructions NONE   Important  Information About Sugar

## 2022-08-07 ENCOUNTER — Encounter: Payer: Self-pay | Admitting: Cardiovascular Disease

## 2022-08-07 NOTE — Telephone Encounter (Signed)
From 07/30/22 OV:  2.  History of coarctation of the aorta-status postrepair.  She had repair at age 51. They used porcine material.  Because of this I do want to give her SBE prophylaxis prior to having dental work.  She has several dental procedures coming up in the next several months.  I would like to get an MR angiogram of her aorta to evaluate her entire aorta.  Will route to MD for review. Can potentially change to CT angio of chest & aorta (possibly cheaper?) or postpone current imaging study.

## 2022-08-13 ENCOUNTER — Ambulatory Visit (HOSPITAL_COMMUNITY): Payer: BC Managed Care – PPO

## 2022-08-13 DIAGNOSIS — N959 Unspecified menopausal and perimenopausal disorder: Secondary | ICD-10-CM | POA: Diagnosis not present

## 2022-08-15 ENCOUNTER — Ambulatory Visit (HOSPITAL_COMMUNITY): Payer: BC Managed Care – PPO

## 2022-08-20 DIAGNOSIS — R7301 Impaired fasting glucose: Secondary | ICD-10-CM | POA: Diagnosis not present

## 2022-08-20 DIAGNOSIS — Z1331 Encounter for screening for depression: Secondary | ICD-10-CM | POA: Diagnosis not present

## 2022-08-20 DIAGNOSIS — R82998 Other abnormal findings in urine: Secondary | ICD-10-CM | POA: Diagnosis not present

## 2022-08-20 DIAGNOSIS — Z0000 Encounter for general adult medical examination without abnormal findings: Secondary | ICD-10-CM | POA: Diagnosis not present

## 2022-08-20 DIAGNOSIS — Z1339 Encounter for screening examination for other mental health and behavioral disorders: Secondary | ICD-10-CM | POA: Diagnosis not present

## 2022-08-24 ENCOUNTER — Ambulatory Visit (HOSPITAL_COMMUNITY): Payer: BC Managed Care – PPO | Attending: Cardiology

## 2022-08-24 DIAGNOSIS — Q231 Congenital insufficiency of aortic valve: Secondary | ICD-10-CM | POA: Diagnosis not present

## 2022-08-24 DIAGNOSIS — Q251 Coarctation of aorta: Secondary | ICD-10-CM | POA: Insufficient documentation

## 2022-08-24 DIAGNOSIS — R002 Palpitations: Secondary | ICD-10-CM | POA: Insufficient documentation

## 2022-08-24 LAB — ECHOCARDIOGRAM COMPLETE
Area-P 1/2: 3.24 cm2
S' Lateral: 2.3 cm

## 2022-08-27 ENCOUNTER — Telehealth: Payer: Self-pay

## 2022-08-27 DIAGNOSIS — Q251 Coarctation of aorta: Secondary | ICD-10-CM

## 2022-08-27 NOTE — Telephone Encounter (Signed)
-----   Message from Vesta Mixer, MD sent at 08/26/2022  4:36 PM EDT ----- Normal LV function  She has a known coarctation repair  Will get a MRA of her aorta  next year  She is not having any issues

## 2022-08-27 NOTE — Telephone Encounter (Signed)
MRA of aorta study placed at this time to be done in 1 year.

## 2023-04-17 DIAGNOSIS — S0502XA Injury of conjunctiva and corneal abrasion without foreign body, left eye, initial encounter: Secondary | ICD-10-CM | POA: Diagnosis not present

## 2023-07-29 ENCOUNTER — Ambulatory Visit: Payer: BC Managed Care – PPO | Admitting: Cardiovascular Disease

## 2023-08-23 DIAGNOSIS — R7301 Impaired fasting glucose: Secondary | ICD-10-CM | POA: Diagnosis not present

## 2023-08-23 DIAGNOSIS — R82998 Other abnormal findings in urine: Secondary | ICD-10-CM | POA: Diagnosis not present

## 2023-08-23 DIAGNOSIS — Z79899 Other long term (current) drug therapy: Secondary | ICD-10-CM | POA: Diagnosis not present

## 2023-09-04 DIAGNOSIS — Z1339 Encounter for screening examination for other mental health and behavioral disorders: Secondary | ICD-10-CM | POA: Diagnosis not present

## 2023-09-04 DIAGNOSIS — Z0000 Encounter for general adult medical examination without abnormal findings: Secondary | ICD-10-CM | POA: Diagnosis not present

## 2023-09-04 DIAGNOSIS — R7301 Impaired fasting glucose: Secondary | ICD-10-CM | POA: Diagnosis not present

## 2023-09-04 DIAGNOSIS — Z1331 Encounter for screening for depression: Secondary | ICD-10-CM | POA: Diagnosis not present

## 2023-10-03 NOTE — Progress Notes (Signed)
Cardiology Office Note:    Date:  10/04/2023   ID:  Janice Barker, DOB 12/15/1971, MRN [id number]  PCP:  Cleatis Polka., MD   [facility] Providers Cardiologist:  Kaamil Morefield     Referring MD: Cleatis Polka., MD   Chief Complaint  Patient presents with   Palpitations    History of Present Illness: 07/30/22   Janice Barker is a 52 y.o. female with a hx of possible bicuspid Aotic valve, coarctation repair at age 76  and palpitations.  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 Clelia Croft for further evaluation and management of her bicuspid AV.   Had repair of coarctation of her aorta at age 44.  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 4, 2024 Janice Barker is seen for follow up of her hx of coarctation of her aorta  S/p repair at age  79.   Echocardiogram from last year revealed normal left ventricular function.  We were not able to visualize the aortic valve very well. Walks regularly.  There was no significant aortic stenosis or aortic insufficiency suggesting a trileaflet valve.  She had increased velocities in her abdominal aorta concerning for coarctation. Will get a CTA of the entire aorta for further evaluation.    Past Medical History:  Diagnosis Date   Anxiety    Blood transfusion without reported diagnosis 1976   Heart murmur    as a child   Infertility, female    Insomnia    Lazy eye, bilateral    Palpitations    SBE (subacute bacterial endocarditis) prophylaxis monitoring     Past Surgical History:  Procedure Laterality Date   CARDIAC SURGERY  1976   CESAREAN SECTION   1994   tooth removal  12/2021   WISDOM TOOTH EXTRACTION  1992    Current Medications: No outpatient medications have been marked as taking for the 10/04/23 encounter (Office Visit) with Devra Stare, Deloris Ping, MD.     Allergies:   Amoxicillin-pot clavulanate, Honey bee venom, Pneumococcal vaccine, Covid-19 (subunit) vaccine, and Tape   Social History   Socioeconomic History   Marital status: Married    Spouse name: Not on file   Number of children: Not on file   Years of education: Not on file   Highest education level: Not on file  Occupational History   Not on file  Tobacco Use   Smoking status: Never   Smokeless tobacco: Never  Vaping Use   Vaping status: Never Used  Substance and Sexual Activity   Alcohol use: Not Currently    Alcohol/week: 0.0 - 1.0 standard drinks of alcohol   Drug use: Never   Sexual activity: Not on file  Other Topics Concern   Not on file  Social History Narrative   Not on file   Social Determinants of Health   Financial Resource Strain: Not on file  Food Insecurity: Not on file  Transportation Needs: Not on file  Physical Activity: Not on file  Stress: Not on  file  Social Connections: Not on file     Family History: The patient's family history includes Bipolar disorder in her sister; CVA in her paternal grandfather and paternal grandmother; Cancer - Lung in her maternal grandmother; Heart attack in her paternal grandfather; Hypertension in her father; Hypothyroidism in her mother; Subarachnoid hemorrhage in her sister. There is no history of Colon polyps, Colon cancer, Esophageal cancer, Stomach cancer, or Rectal cancer.  ROS:   Please see the history of present illness.     All other systems reviewed and are negative.  EKGs/Labs/Other Studies Reviewed:    The following studies were reviewed today:   EKG:       Recent Labs: No results found for requested labs within last 365 days.  Recent Lipid Panel No results found for: "CHOL",  "TRIG", "HDL", "CHOLHDL", "VLDL", "LDLCALC", "LDLDIRECT"   Risk Assessment/Calculations:             Physical Exam:    Physical Exam: Blood pressure 136/84, pulse 64, height 5' (1.524 m), weight 159 lb (72.1 kg), SpO2 97%.      GEN:  Well nourished, well developed in no acute distress HEENT: Normal NECK: No JVD; No carotid bruits LYMPHATICS: No lymphadenopathy CARDIAC:  soft systolic murmur at LSB .   RESPIRATORY:  Clear to auscultation without rales, wheezing or rhonchi  ABDOMEN: Soft, non-tender, non-distended.  No abdominal bruits,  she has a small outpouching area on her upper abdomen ( casued by lack of dermis in that area)  MUSCULOSKELETAL:  No edema; No deformity  SKIN: Warm and dry NEUROLOGIC:  Alert and oriented x 3   ASSESSMENT:    1. Coarctation of aorta   2. Palpitations   3. Bicuspid aortic valve   4. Pre-procedural cardiovascular examination     PLAN:      Palpitations:    no further palpitations   2.  History of coarctation of the aorta-status postrepair.   Will get a CTA of her entire aorta for further evaluation.  3.?  History of possible bicuspid aortic valve: Recent echoes have suggested a try leaflet aortic valve.  She does not have any significant aortic stenosis or aortic insufficiency by echo.  At this point I do not think that it is necessary to confirm whether or not this is a bicuspid valve.  If she is found to have dilatation of the ascending aorta we certainly could consider further evaluation of her aortic valve.   Medication Adjustments/Labs and Tests Ordered: Current medicines are reviewed at length with the patient today.  Concerns regarding medicines are outlined above.  Orders Placed This Encounter  Procedures   CT Angio Abd/Pel w/ and/or w/o   CT ANGIO CHEST AORTA W/ & OR WO/CM & GATING ( ONLY)   Basic metabolic panel   EKG 12-Lead   Meds ordered this encounter  Medications   amoxicillin (AMOXIL) 500 MG capsule     Sig: TAKE 4 TABS 1 HOUR PRIOR TO PROCEDURE    Dispense:  20 capsule    Refill:  0    Patient Instructions  Medication Instructions:  REFILLED Amoxicillin *If you need a refill on your cardiac medications before your next appointment, please call your pharmacy*  Lab Work: BMET today If you have labs (blood work) drawn today and your tests are completely normal, you will receive your results only by: MyChart Message (if you have MyChart) OR A paper copy in the mail If you have any lab test that is  abnormal or we need to change your treatment, we will call you to review the results.  Testing/Procedures: CTA of chest/abdomen/pelvis Non-Cardiac CT Angiography (CTA), is a special type of CT scan that uses a computer to produce multi-dimensional views of major blood vessels throughout the body. In CT angiography, a contrast material is injected through an IV to help visualize the blood vessels  Follow-Up: At [facility], you and your health needs are our priority.  As part of our continuing mission to provide you with exceptional heart care, we have created designated Provider Care Teams.  These Care Teams include your primary Cardiologist (physician) and Advanced Practice Providers (APPs -  Physician Assistants and Nurse Practitioners) who all work together to provide you with the care you need, when you need it.  Your next appointment:   1 year(s)  Provider:   Rudene Anda, MD   Other Instructions   Your cardiac CT will be scheduled at:   [facility] [address] [place], [place] [address] [phone]  please arrive at the [facility] and Children's Entrance (Entrance C2) of [facility] 30 minutes prior to test start time. You can use the FREE valet parking offered at entrance C (encouraged to control the heart rate for the test)  Proceed to the [facility] Radiology Department (first floor) to check-in and test prep.  All radiology patients and guests  should use entrance C2 at [facility], accessed from [facility], even though the hospital's physical address listed is [address].     Please follow these instructions carefully (unless otherwise directed):  An IV will be required for this test and Nitroglycerin will be given.   On the Night Before the Test: Be sure to Drink plenty of water. Do not consume any caffeinated/decaffeinated beverages or chocolate 12 hours prior to your test. Do not take any antihistamines 12 hours prior to your test.  On the Day of the Test: Drink plenty of water until 1 hour prior to the test. Do not eat any food 1 hour prior to test. You may take your regular medications prior to the test.  FEMALES- please wear underwire-free bra if available, avoid dresses & tight clothing      After the Test: Drink plenty of water. After receiving IV contrast, you may experience a mild flushed feeling. This is normal. On occasion, you may experience a mild rash up to 24 hours after the test. This is not dangerous. If this occurs, you can take Benadryl 25 mg and increase your fluid intake. If you experience trouble breathing, this can be serious. If it is severe call 911 IMMEDIATELY. If it is mild, please call our office. If you take any of these medications: Glipizide/Metformin, Avandament, Glucavance, please do not take 48 hours after completing test unless otherwise instructed.  We will call to schedule your test 2-4 weeks out understanding that some insurance companies will need an authorization prior to the service being performed.   For more information and frequently asked questions, please visit our website : [email or web address]  For non-scheduling related questions, please contact the cardiac imaging nurse navigator should you have any questions/concerns: Cardiac Imaging Nurse Navigators Direct Office Dial: [phone]   For scheduling needs, including  cancellations and rescheduling, please call [place], [phone].    Signed, Kristeen Miss, MD  10/04/2023 10:49 AM    [facility]

## 2023-10-04 ENCOUNTER — Encounter: Payer: Self-pay | Admitting: Cardiovascular Disease

## 2023-10-04 ENCOUNTER — Ambulatory Visit: Payer: BC Managed Care – PPO | Attending: Cardiovascular Disease | Admitting: Cardiovascular Disease

## 2023-10-04 VITALS — BP 136/84 | HR 64 | Ht 60.0 in | Wt 159.0 lb

## 2023-10-04 DIAGNOSIS — R002 Palpitations: Secondary | ICD-10-CM

## 2023-10-04 DIAGNOSIS — Z01810 Encounter for preprocedural cardiovascular examination: Secondary | ICD-10-CM

## 2023-10-04 DIAGNOSIS — Q251 Coarctation of aorta: Secondary | ICD-10-CM

## 2023-10-04 DIAGNOSIS — Q2381 Bicuspid aortic valve: Secondary | ICD-10-CM

## 2023-10-04 MED ORDER — AMOXICILLIN 500 MG PO CAPS: ORAL_CAPSULE | ORAL | 0 refills | Status: DC

## 2023-10-04 NOTE — Patient Instructions (Addendum)
Medication Instructions:  REFILLED Amoxicillin *If you need a refill on your cardiac medications before your next appointment, please call your pharmacy*  Lab Work: BMET today If you have labs (blood work) drawn today and your tests are completely normal, you will receive your results only by: MyChart Message (if you have MyChart) OR A paper copy in the mail If you have any lab test that is abnormal or we need to change your treatment, we will call you to review the results.  Testing/Procedures: CTA of chest/abdomen/pelvis Non-Cardiac CT Angiography (CTA), is a special type of CT scan that uses a computer to produce multi-dimensional views of major blood vessels throughout the body. In CT angiography, a contrast material is injected through an IV to help visualize the blood vessels  Follow-Up: At [facility], you and your health needs are our priority.  As part of our continuing mission to provide you with exceptional heart care, we have created designated Provider Care Teams.  These Care Teams include your primary Cardiologist (physician) and Advanced Practice Providers (APPs -  Physician Assistants and Nurse Practitioners) who all work together to provide you with the care you need, when you need it.  Your next appointment:   1 year(s)  Provider:   Rudene Anda, MD   Other Instructions   Your cardiac CT will be scheduled at:   [facility] [address] [place], [place] [address] [phone]  please arrive at the [facility] and Children's Entrance (Entrance C2) of [facility] 30 minutes prior to test start time. You can use the FREE valet parking offered at entrance C (encouraged to control the heart rate for the test)  Proceed to the [facility] Radiology Department (first floor) to check-in and test prep.  All radiology patients and guests should use entrance C2 at [facility], accessed from [facility], even though the hospital's  physical address listed is [address]     Please follow these instructions carefully (unless otherwise directed):  An IV will be required for this test and Nitroglycerin will be given.   On the Night Before the Test: Be sure to Drink plenty of water. Do not consume any caffeinated/decaffeinated beverages or chocolate 12 hours prior to your test. Do not take any antihistamines 12 hours prior to your test.  On the Day of the Test: Drink plenty of water until 1 hour prior to the test. Do not eat any food 1 hour prior to test. You may take your regular medications prior to the test.  FEMALES- please wear underwire-free bra if available, avoid dresses & tight clothing      After the Test: Drink plenty of water. After receiving IV contrast, you may experience a mild flushed feeling. This is normal. On occasion, you may experience a mild rash up to 24 hours after the test. This is not dangerous. If this occurs, you can take Benadryl 25 mg and increase your fluid intake. If you experience trouble breathing, this can be serious. If it is severe call 911 IMMEDIATELY. If it is mild, please call our office. If you take any of these medications: Glipizide/Metformin, Avandament, Glucavance, please do not take 48 hours after completing test unless otherwise instructed.  We will call to schedule your test 2-4 weeks out understanding that some insurance companies will need an authorization prior to the service being performed.   For more information and frequently asked questions, please visit our website : [email or web address]  For  non-scheduling related questions, please contact the cardiac imaging nurse navigator should you have any questions/concerns: Cardiac Imaging Nurse Navigators Direct Office Dial: [phone]   For scheduling needs, including cancellations and rescheduling, please call [place], [phone].

## 2023-10-05 LAB — BASIC METABOLIC PANEL
BUN/Creatinine Ratio: 12 (ref 9–23)
BUN: 10 mg/dL (ref 6–24)
CO2: 22 mmol/L (ref 20–29)
Calcium: 9.2 mg/dL (ref 8.7–10.2)
Chloride: 105 mmol/L (ref 96–106)
Creatinine, Ser: 0.81 mg/dL (ref 0.57–1.00)
Glucose: 112 mg/dL — ABNORMAL HIGH (ref 70–99)
Potassium: 4.3 mmol/L (ref 3.5–5.2)
Sodium: 142 mmol/L (ref 134–144)
eGFR: 87 mL/min/{1.73_m2} (ref >59)

## 2023-12-03 DIAGNOSIS — J0100 Acute maxillary sinusitis, unspecified: Secondary | ICD-10-CM | POA: Diagnosis not present

## 2024-02-04 DIAGNOSIS — Z1231 Encounter for screening mammogram for malignant neoplasm of breast: Secondary | ICD-10-CM | POA: Diagnosis not present

## 2024-02-04 DIAGNOSIS — N912 Amenorrhea, unspecified: Secondary | ICD-10-CM | POA: Diagnosis not present

## 2024-02-04 DIAGNOSIS — R58 Hemorrhage, not elsewhere classified: Secondary | ICD-10-CM | POA: Diagnosis not present

## 2024-02-04 DIAGNOSIS — Z01419 Encounter for gynecological examination (general) (routine) without abnormal findings: Secondary | ICD-10-CM | POA: Diagnosis not present

## 2024-02-04 DIAGNOSIS — Z6831 Body mass index (BMI) 31.0-31.9, adult: Secondary | ICD-10-CM | POA: Diagnosis not present

## 2024-03-16 ENCOUNTER — Encounter: Payer: Self-pay | Admitting: Cardiovascular Disease

## 2024-05-15 DIAGNOSIS — M25552 Pain in left hip: Secondary | ICD-10-CM | POA: Diagnosis not present

## 2024-05-15 DIAGNOSIS — R030 Elevated blood-pressure reading, without diagnosis of hypertension: Secondary | ICD-10-CM | POA: Diagnosis not present

## 2024-05-20 DIAGNOSIS — M25552 Pain in left hip: Secondary | ICD-10-CM | POA: Diagnosis not present

## 2024-05-20 DIAGNOSIS — M9905 Segmental and somatic dysfunction of pelvic region: Secondary | ICD-10-CM | POA: Diagnosis not present

## 2024-05-20 DIAGNOSIS — M5116 Intervertebral disc disorders with radiculopathy, lumbar region: Secondary | ICD-10-CM | POA: Diagnosis not present

## 2024-05-20 DIAGNOSIS — M9903 Segmental and somatic dysfunction of lumbar region: Secondary | ICD-10-CM | POA: Diagnosis not present

## 2024-06-22 DIAGNOSIS — L821 Other seborrheic keratosis: Secondary | ICD-10-CM | POA: Diagnosis not present

## 2024-07-08 ENCOUNTER — Encounter: Payer: Self-pay | Admitting: Cardiovascular Disease

## 2024-09-03 ENCOUNTER — Emergency Department (HOSPITAL_COMMUNITY)
Admission: EM | Admit: 2024-09-03 | Discharge: 2024-09-03 | Disposition: A | Discharge status: Home / Self Care | Payer: Worker's Compensation | Attending: Emergency Medicine | Admitting: Emergency Medicine

## 2024-09-03 ENCOUNTER — Emergency Department (HOSPITAL_COMMUNITY)

## 2024-09-03 ENCOUNTER — Other Ambulatory Visit: Payer: Self-pay

## 2024-09-03 ENCOUNTER — Encounter (HOSPITAL_COMMUNITY): Payer: Self-pay

## 2024-09-03 DIAGNOSIS — R011 Cardiac murmur, unspecified: Secondary | ICD-10-CM | POA: Diagnosis not present

## 2024-09-03 DIAGNOSIS — W208XXA Other cause of strike by thrown, projected or falling object, initial encounter: Secondary | ICD-10-CM | POA: Insufficient documentation

## 2024-09-03 DIAGNOSIS — S161XXA Strain of muscle, fascia and tendon at neck level, initial encounter: Secondary | ICD-10-CM | POA: Insufficient documentation

## 2024-09-03 DIAGNOSIS — S0990XA Unspecified injury of head, initial encounter: Secondary | ICD-10-CM | POA: Diagnosis not present

## 2024-09-03 DIAGNOSIS — S199XXA Unspecified injury of neck, initial encounter: Secondary | ICD-10-CM | POA: Diagnosis present

## 2024-09-03 DIAGNOSIS — Y990 Civilian activity done for income or pay: Secondary | ICD-10-CM | POA: Diagnosis not present

## 2024-09-03 MED ORDER — CYCLOBENZAPRINE HCL 10 MG PO TABS
5.0000 mg | ORAL_TABLET | Freq: Once | ORAL | Status: AC
  Administered 2024-09-03: 5 mg via ORAL
  Filled 2024-09-03: qty 1

## 2024-09-03 MED ORDER — IBUPROFEN 200 MG PO TABS
600.0000 mg | ORAL_TABLET | Freq: Once | ORAL | Status: AC
  Administered 2024-09-03: 600 mg via ORAL
  Filled 2024-09-03: qty 3

## 2024-09-03 MED ORDER — IBUPROFEN 600 MG PO TABS: 600.0000 mg | ORAL_TABLET | Freq: Four times a day (QID) | ORAL | 0 refills | Status: AC | PRN

## 2024-09-03 MED ORDER — ONDANSETRON 4 MG PO TBDP
4.0000 mg | ORAL_TABLET | Freq: Once | ORAL | Status: AC
  Administered 2024-09-03: 4 mg via ORAL
  Filled 2024-09-03: qty 1

## 2024-09-03 MED ORDER — ONDANSETRON HCL 4 MG/2ML IJ SOLN: 4.0000 mg | Freq: Once | INTRAMUSCULAR | Status: DC

## 2024-09-03 MED ORDER — CYCLOBENZAPRINE HCL 5 MG PO TABS: 5.0000 mg | ORAL_TABLET | Freq: Two times a day (BID) | ORAL | 0 refills | Status: AC | PRN

## 2024-09-03 NOTE — ED Triage Notes (Signed)
 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 Pt denies any LOC. Pt c/o headache and upper back/shoulder pain.

## 2024-09-03 NOTE — ED Provider Notes (Signed)
 [place] EMERGENCY DEPARTMENT AT [facility] Provider Note   CSN: [id number] Arrival date & time: 09/03/24  1405     Patient presents with: Head Injury   Janice Barker is a 53 y.o. female.   HPI     This a 53 year old female who presents that injury.  She teaches kindergarten.  She was sitting in the chair when a shelf fell on her head and the left side of her neck.  She did not lose consciousness.  She is not on any blood thinners.  She carried on throughout her school day.  She is reporting headache and left-sided neck and shoulder pain.  No nausea  No vomiting.  She states that the lights were bright.  Prior to Admission medications   Medication Sig Start Date End Date Taking? Authorizing Provider  cyclobenzaprine  (FLEXERIL ) 5 MG tablet Take 1 tablet (5 mg total) by mouth 2 (two) times daily as needed for muscle spasms. 09/03/24  Yes Misako Roeder, Charmaine FALCON, MD  ibuprofen  (ADVIL ) 600 MG tablet Take 1 tablet (600 mg total) by mouth every 6 (six) hours as needed. 09/03/24  Yes Maguire Sime, Charmaine FALCON, MD  ALPRAZolam (XANAX) 0.25 MG tablet Take 0.25 mg by mouth at bedtime as needed for anxiety.    [provider]  amoxicillin  (AMOXIL ) 500 MG capsule TAKE 4 TABS 1 HOUR PRIOR TO PROCEDURE 10/04/23   Nahser, Aleene PARAS, MD  CHARLOTTE 24 FE 1-20 MG-MCG(24) CHEW Chew 1 tablet by mouth daily. 12/22/21   [provider]  Multiple Vitamins-Minerals (WOMENS MULTIVITAMIN PLUS PO) Take by mouth.    [provider]    Allergies: Amoxicillin -pot clavulanate, Honey bee venom, Pneumococcal vaccine, Covid-19 (subunit) vaccine, and Tape    Review of Systems  Gastrointestinal:  Positive for nausea.  Neurological:  Positive for headaches.  All other systems reviewed and are negative.   Updated Vital Signs BP (!) 190/95 (BP Location: Left Arm)   Pulse 94   Temp 98.7 F (37.1 C) (Oral)   Resp 16   SpO2 100%   Physical Exam Vitals and nursing note reviewed.   Constitutional:      Appearance: She is well-developed. She is not ill-appearing.  HENT:     Head: Normocephalic and atraumatic.  Eyes:     Pupils: Pupils are equal, round, and reactive to light.  Cardiovascular:     Rate and Rhythm: Normal rate and regular rhythm.     Heart sounds: Murmur heard.  Pulmonary:     Effort: Pulmonary effort is normal. No respiratory distress.     Breath sounds: No wheezing.  Abdominal:     Palpations: Abdomen is soft.  Musculoskeletal:     Cervical back: Neck supple.     Comments: Tenderness to palpation on the left paraspinous muscle region of the cervical spine, no midline tenderness, step-off, deformity, normal range of motion  Skin:    General: Skin is warm and dry.  Neurological:     Mental Status: She is alert and oriented to person, place, and time.     Comments: Fluent speech, cranial nerves II through XII intact, 5 out of 5 strength bilateral upper extremities  Psychiatric:        Mood and Affect: Mood normal.     (all labs ordered are listed, but only abnormal results are displayed) Labs Reviewed - No data to display  EKG: None  Radiology: CT Head Wo Contrast Result Date: 09/03/2024 CLINICAL DATA:  Head trauma, moderate-severe EXAM: CT HEAD  WITHOUT CONTRAST TECHNIQUE: Contiguous axial images were obtained from the base of the skull through the vertex without intravenous contrast. RADIATION DOSE REDUCTION: This exam was performed according to the departmental dose-optimization program which includes automated exposure control, adjustment of the mA and/or kV according to patient size and/or use of iterative reconstruction technique. COMPARISON:  None Available. FINDINGS: Brain: No evidence of large-territorial acute infarction. No parenchymal hemorrhage. No mass lesion. No extra-axial collection. No mass effect or midline shift. No hydrocephalus. Basilar cisterns are patent. Vascular: No hyperdense vessel. Skull: No acute fracture or focal  lesion. Sinuses/Orbits: Paranasal sinuses and mastoid air cells are clear. The orbits are unremarkable. Other: None. IMPRESSION: No acute intracranial abnormality. Electronically Signed   By: Morgane  Naveau M.D.   On: 09/03/2024 16:44     Procedures   Medications Ordered in the ED  cyclobenzaprine  (FLEXERIL ) tablet 5 mg (5 mg Oral Given 09/03/24 1452)  ibuprofen  (ADVIL ) tablet 600 mg (600 mg Oral Given 09/03/24 1452)  ondansetron  (ZOFRAN -ODT) disintegrating tablet 4 mg (4 mg Oral Given 09/03/24 1452)                                    Medical Decision Making Amount and/or Complexity of Data Reviewed Radiology: ordered.  Risk OTC drugs. Prescription drug management.   This patient presents to the ED for concern of head injury, this involves an extensive number of treatment options, and is a complaint that carries with it a high risk of complications and morbidity.  I considered the following differential and admission for this acute, potentially life threatening condition.  The differential diagnosis includes head injury, contusion, concussion, head bleed, neck injury  MDM:    This is a 53 year old female who presents with headache following a minor injury.  Patient reports a shelf falling on her.  She is low risk although this was at work.  She is complaining of some nausea.  She is neurologically intact.  Vital signs notable for blood pressure of 190/95.  May be related to acute illness.  Patient was given Flexeril , ibuprofen , and Zofran .  CT scan of the head does not show any evidence of acute bleed.  Favor minor head injury versus minor concussion.  We discussed concussion precautions.  She will be given a work note.  Likely also has some muscle strain related.  Do not feel she needs CT imaging of the neck as she does not meet Nexus criteria.  (Labs, imaging, consults)  Labs: I Ordered, and personally interpreted labs.  The pertinent results include: N/A  Imaging Studies ordered: I  ordered imaging studies including CT head I independently visualized and interpreted imaging. I agree with the radiologist interpretation  Additional history obtained from chart review.  External records from outside source obtained and reviewed including prior evaluations  Cardiac Monitoring: The patient was not maintained on a cardiac monitor.  If on the cardiac monitor, I personally viewed and interpreted the cardiac monitored which showed an underlying rhythm of: N/A  Reevaluation: After the interventions noted above, I reevaluated the patient and found that they have :improved  Social Determinants of Health:  lives independently  Disposition: Discharge  Co morbidities that complicate the patient evaluation  Past Medical History:  Diagnosis Date   Anxiety    Blood transfusion without reported diagnosis 1976   Heart murmur    as a child   Infertility, female    Insomnia  Lazy eye, bilateral    Palpitations    SBE (subacute bacterial endocarditis) prophylaxis monitoring      Medicines Meds ordered this encounter  Medications   cyclobenzaprine  (FLEXERIL ) tablet 5 mg   DISCONTD: ondansetron  (ZOFRAN ) injection 4 mg   ibuprofen  (ADVIL ) tablet 600 mg   ondansetron  (ZOFRAN -ODT) disintegrating tablet 4 mg   ibuprofen  (ADVIL ) 600 MG tablet    Sig: Take 1 tablet (600 mg total) by mouth every 6 (six) hours as needed.    Dispense:  30 tablet    Refill:  0   cyclobenzaprine  (FLEXERIL ) 5 MG tablet    Sig: Take 1 tablet (5 mg total) by mouth 2 (two) times daily as needed for muscle spasms.    Dispense:  10 tablet    Refill:  0    I have reviewed the patients home medicines and have made adjustments as needed  Problem List / ED Course: Problem List Items Addressed This Visit   None Visit Diagnoses       Minor head injury, initial encounter    -  Primary     Acute strain of neck muscle, initial encounter                    Final diagnoses:  Minor head injury,  initial encounter  Acute strain of neck muscle, initial encounter    ED Discharge Orders          Ordered    ibuprofen  (ADVIL ) 600 MG tablet  Every 6 hours PRN        09/03/24 1712    cyclobenzaprine  (FLEXERIL ) 5 MG tablet  2 times daily PRN        09/03/24 1712               Bari Charmaine FALCON, MD 09/03/24 [phone]

## 2024-09-03 NOTE — Discharge Instructions (Signed)
 You were seen today for head injury and neck injury.  Your workup is largely reassuring.  This is likely a minor injury.  However if you develop persistent headache, confusion, nausea, vision changes, fogginess, you may be experiencing concussive symptoms.  Follow-up with your primary doctor if this happens.  Make sure that you are getting plenty of rest.

## 2024-09-17 DIAGNOSIS — Z0189 Encounter for other specified special examinations: Secondary | ICD-10-CM | POA: Diagnosis not present

## 2024-09-17 DIAGNOSIS — R82998 Other abnormal findings in urine: Secondary | ICD-10-CM | POA: Diagnosis not present

## 2024-09-24 DIAGNOSIS — I10 Essential (primary) hypertension: Secondary | ICD-10-CM | POA: Diagnosis not present

## 2024-09-24 DIAGNOSIS — Z1331 Encounter for screening for depression: Secondary | ICD-10-CM | POA: Diagnosis not present

## 2024-09-24 DIAGNOSIS — Z1339 Encounter for screening examination for other mental health and behavioral disorders: Secondary | ICD-10-CM | POA: Diagnosis not present

## 2024-09-24 DIAGNOSIS — Z0000 Encounter for general adult medical examination without abnormal findings: Secondary | ICD-10-CM | POA: Diagnosis not present

## 2024-10-01 DIAGNOSIS — D485 Neoplasm of uncertain behavior of skin: Secondary | ICD-10-CM | POA: Diagnosis not present

## 2024-10-01 DIAGNOSIS — L821 Other seborrheic keratosis: Secondary | ICD-10-CM | POA: Diagnosis not present

## 2024-10-01 DIAGNOSIS — L814 Other melanin hyperpigmentation: Secondary | ICD-10-CM | POA: Diagnosis not present

## 2024-10-01 DIAGNOSIS — D1801 Hemangioma of skin and subcutaneous tissue: Secondary | ICD-10-CM | POA: Diagnosis not present

## 2024-10-09 ENCOUNTER — Encounter: Payer: Self-pay | Admitting: Internal Medicine

## 2024-10-09 ENCOUNTER — Ambulatory Visit: Attending: Internal Medicine | Admitting: Internal Medicine

## 2024-10-09 VITALS — BP 136/79 | HR 84 | Ht 61.0 in

## 2024-10-09 DIAGNOSIS — R002 Palpitations: Secondary | ICD-10-CM | POA: Diagnosis not present

## 2024-10-09 DIAGNOSIS — Q251 Coarctation of aorta: Secondary | ICD-10-CM | POA: Diagnosis not present

## 2024-10-09 DIAGNOSIS — Q2381 Bicuspid aortic valve: Secondary | ICD-10-CM

## 2024-10-09 DIAGNOSIS — I10 Essential (primary) hypertension: Secondary | ICD-10-CM

## 2024-10-09 MED ORDER — AMOXICILLIN 500 MG PO CAPS: ORAL_CAPSULE | ORAL | 1 refills | Status: AC

## 2024-10-09 NOTE — Patient Instructions (Signed)
 Medication Instructions:  Your physician recommends that you continue on your current medications as directed. Please refer to the Current Medication list given to you today. *If you need a refill on your cardiac medications before your next appointment, please call your pharmacy*  Lab Work: None ordered If you have labs (blood work) drawn today and your tests are completely normal, you will receive your results only by: MyChart Message (if you have MyChart) OR A paper copy in the mail If you have any lab test that is abnormal or we need to change your treatment, we will call you to review the results.  Testing/Procedures: CT Angio Chest Aorta  Your physician has requested that you have an echocardiogram. Echocardiography is a painless test that uses sound waves to create images of your heart. It provides your doctor with information about the size and shape of your heart and how well your heart's chambers and valves are working. This procedure takes approximately one hour. There are no restrictions for this procedure. Please do NOT wear cologne, perfume, aftershave, or lotions (deodorant is allowed). Please arrive 15 minutes prior to your appointment time.  Please note: We ask at that you not bring children with you during ultrasound (echo/ vascular) testing. Due to room size and safety concerns, children are not allowed in the ultrasound rooms during exams. Our front office staff cannot provide observation of children in our lobby area while testing is being conducted. An adult accompanying a patient to their appointment will only be allowed in the ultrasound room at the discretion of the ultrasound technician under special circumstances. We apologize for any inconvenience.  Follow-Up: At [facility], you and your health needs are our priority.  As part of our continuing mission to provide you with exceptional heart care, our providers are all part of one team.  This team includes  your primary Cardiologist (physician) and Advanced Practice Providers or APPs (Physician Assistants and Nurse Practitioners) who all work together to provide you with the care you need, when you need it.  Your next appointment:   1 month(s)  Provider:   Soyla Merck, MD  We recommend signing up for the patient portal called MyChart.  Sign up information is provided on this After Visit Summary.  MyChart is used to connect with patients for Virtual Visits (Telemedicine).  Patients are able to view lab/test results, encounter notes, upcoming appointments, etc.  Non-urgent messages can be sent to your provider as well.   To learn more about what you can do with MyChart, go to [email or web address].   Other Instructions

## 2024-10-09 NOTE — Progress Notes (Signed)
 Cardiology Office Note:  .   Date:  10/09/2024  ID:  Janice Barker, DOB [birth year], MRN [id number] PCP: Loreli Elsie JONETTA Mickey., MD  [facility] Providers Cardiologist:  None    History of Present Illness: .   Janice Barker is a 53 y.o. female.  Discussed the use of AI scribe software for clinical note transcription with the patient, who gave verbal consent to proceed.  History of Present Illness Janice Barker is a 53 year old female with a history of coarctation repair who presents with elevated blood pressure.  She has experienced elevated blood pressure since an injury a month ago where a kindergarten student of hers caused a shelf to fall on her head and neck. Despite starting olmesartan 20 mg two weeks ago, her blood pressure remains elevated on review of her home log. She experiences arm pain during blood pressure measurement when readings are high. She monitors her blood pressure at home, taking readings in the morning and at night. She has not demonstrated differential blood pressures at PCP office.  She underwent coarctation repair at age [age], involving prosthetic porcine material. Her scar is on her back. She undergoes regular monitoring with echocardiograms, with the last echocardiogram in August 2023. Velocities are mildly elevated, anatomy difficult to visualize on echo. Historically, blood pressure measurements in different body parts have not shown significant differences.  Her family history includes hypertension in her father and grandfather. She has not typically had high blood pressure except during pregnancy. One spontaneous pregnancy, the second pregnancy was fertility treatment assisted. Normal menstrual periods during her life.   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     ROS: negative except per HPI above.  Studies Reviewed: SABRA   EKG Interpretation Date/Time:  Friday October 09 2024  09:20:45 EDT Ventricular Rate:  91 PR Interval:  138 QRS Duration:  78 QT Interval:  384 QTC Calculation: 472 R Axis:   -11  Text Interpretation: Normal sinus rhythm Normal ECG Confirmed by Loni Rushing ([id number]) on 10/09/2024 9:43:37 AM    Results LABS Blood counts: Normal Kidney function: Normal Platelet count: Normal  DIAGNOSTIC Echocardiogram: Normal cardiac function, aortic valve assessment limited by breast tissue, increased flow velocity across coarctation repair site (07/2022) Risk Assessment/Calculations:       Physical Exam:   VS:  BP 136/79 (Cuff Size: Normal)   Pulse 84   Ht 5' 1 (1.549 m)   SpO2 98%   BMI 30.04 kg/m    Wt Readings from Last 3 Encounters:  10/04/23 159 lb (72.1 kg)  07/30/22 157 lb 6.4 oz (71.4 kg)  03/07/22 165 lb (74.8 kg)     Physical Exam GENERAL: Alert, cooperative, well developed, no acute distress. HEENT: Normocephalic, normal oropharynx, moist mucous membranes. CHEST: Clear to auscultation bilaterally, no wheezes, rhonchi, or crackles. CARDIOVASCULAR: Normal heart rate and rhythm, S1 and S2 normal without murmurs. Heart normal on auscultation. ABDOMEN: Soft, non-tender, non-distended, without organomegaly, normal bowel sounds. EXTREMITIES: No cyanosis or edema. NEUROLOGICAL: Cranial nerves grossly intact, moves all extremities without gross motor or sensory deficit.   ASSESSMENT AND PLAN: .    Assessment and Plan Assessment & Plan Coarctation of the aorta, status post surgical repair with bioprosthetic material Elevated blood pressure suggests possible stenosis at repair site. Echocardiogram indicated mildly elevated velocities. 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  - Order CT scan of the aorta  to assess repair site. - Order echocardiogram to evaluate heart function and repair site. - After review with colleague, Turner's syndrome with mosaicism is possible, patient  defers further investigation currently which is reasonable. No dental prophylaxis is required remotely after repair.  - Echos challenging to tell aortic valve morphology, will reassess with next echo. At one time, described as bicuspid.   Hypertension in the setting of prior coarctation repair Recent hypertension possibly related to genetic predisposition or coarctation repair. Blood pressure elevated despite olmesartan 20 mg. Family history noted. Consider medication adjustment or additional antihypertensives. - Continue olmesartan 20 mg. Can uptitrate or add amlodipine with PCP.  - Monitor blood pressure at home. - Consider renal ultrasound if hypertension persists after coarctation evaluation for continued secondary workup of HTN.  Recurrent sinus symptoms, likely allergic rhinitis versus recurrent sinusitis Recurrent sinus symptoms possibly due to allergic rhinitis or sinusitis. Symptoms persist despite Allegra. Avoid decongestants with 'D' component due to blood pressure concerns. - Avoid decongestants with 'D' component. - Review treatment options with primary care provider.  Recording duration: 27 minutes  I spent 40 minutes in the care of Janice Barker today including reviewing labs (08/23/23, 10/04/23 bmet and lipids), reviewing studies (echo 2023), face to face time discussing treatment options (27 mins), and documenting in the encounter.     Soyla Merck, MD, FACC

## 2024-10-11 ENCOUNTER — Encounter: Payer: Self-pay | Admitting: Internal Medicine

## 2024-10-23 DIAGNOSIS — I10 Essential (primary) hypertension: Secondary | ICD-10-CM | POA: Diagnosis not present

## 2024-11-18 ENCOUNTER — Ambulatory Visit: Admitting: Internal Medicine

## 2024-11-24 ENCOUNTER — Encounter: Payer: Self-pay | Admitting: Internal Medicine

## 2024-11-24 ENCOUNTER — Telehealth: Payer: Self-pay | Admitting: Internal Medicine

## 2024-11-24 NOTE — Telephone Encounter (Signed)
 Pt calling in regard to her CT ANGIO CHEST AORTA  scheduled 11/26. Pt received a call stating she does not have insurance and would have to reschedule. She states she does have insurance. Pt requesting a callback for explanation, she is very upset. Please advise.

## 2024-11-24 NOTE — Telephone Encounter (Signed)
 Left message for pt to call.

## 2024-11-24 NOTE — Telephone Encounter (Signed)
 Spoke with pt, she was told today that she did not have insurance for the CCTA for tomorrow and therefore the testing was cancelled. According to pre-cert she is not active on their site until 2026. 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 She cancelled her echo for tomorrow as she feels it is a waste of her time. The pre-cert department is going to reach out to her insurance and let us  know what they find out.

## 2024-11-24 NOTE — Telephone Encounter (Signed)
 Spoke with pt, Insurance is building an eligibility case for the patient. She doesn't populate in the system they use for auths.   It will have to be built and given 24-48 hrs for review.

## 2024-11-24 NOTE — Telephone Encounter (Signed)
 Duplicate message, sent other MyChart message to Dr. Loni and her nurse to review.

## 2024-11-25 ENCOUNTER — Other Ambulatory Visit (HOSPITAL_COMMUNITY)

## 2024-11-25 ENCOUNTER — Ambulatory Visit (HOSPITAL_COMMUNITY): Payer: Self-pay

## 2024-11-25 ENCOUNTER — Encounter (HOSPITAL_COMMUNITY): Payer: Self-pay

## 2024-12-03 NOTE — Telephone Encounter (Signed)
 Called and spoke to pt. She is in agreement for cancellation of 12/10/24 appt. Needs Echo and CCTA prior to f/u OV with Dr. Loni. Pt states she will attempt to call and schedule Echo and CCTA on 12/04/2024. 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

## 2024-12-04 ENCOUNTER — Ambulatory Visit (HOSPITAL_COMMUNITY)
Admission: RE | Admit: 2024-12-04 | Discharge: 2024-12-04 | Disposition: A | Source: Ambulatory Visit | Attending: Internal Medicine

## 2024-12-04 DIAGNOSIS — R002 Palpitations: Secondary | ICD-10-CM

## 2024-12-04 DIAGNOSIS — Q251 Coarctation of aorta: Secondary | ICD-10-CM | POA: Diagnosis not present

## 2024-12-04 LAB — ECHOCARDIOGRAM COMPLETE
Area-P 1/2: 2.95 cm2
S' Lateral: 2.82 cm

## 2024-12-10 ENCOUNTER — Ambulatory Visit: Admitting: Internal Medicine

## 2024-12-15 ENCOUNTER — Ambulatory Visit (HOSPITAL_COMMUNITY): Admission: RE | Admit: 2024-12-15 | Discharge: 2024-12-15 | Attending: Internal Medicine

## 2024-12-15 DIAGNOSIS — Q251 Coarctation of aorta: Secondary | ICD-10-CM

## 2024-12-15 DIAGNOSIS — R002 Palpitations: Secondary | ICD-10-CM | POA: Insufficient documentation

## 2024-12-15 DIAGNOSIS — Q2381 Bicuspid aortic valve: Secondary | ICD-10-CM | POA: Diagnosis not present

## 2024-12-15 MED ORDER — IOHEXOL 350 MG/ML SOLN
75.0000 mL | Freq: Once | INTRAVENOUS | Status: AC | PRN
  Administered 2024-12-15: 16:00:00 75 mL via INTRAVENOUS

## 2024-12-17 ENCOUNTER — Telehealth: Payer: Self-pay | Admitting: Internal Medicine

## 2024-12-17 NOTE — Telephone Encounter (Signed)
 Spoke with patient. Appointment made for January with Dr. Acharya. Pt verbalizes understanding of plan.

## 2024-12-17 NOTE — Telephone Encounter (Signed)
 Patient she was returning call. Please advise

## 2024-12-18 ENCOUNTER — Telehealth: Admitting: Family Medicine

## 2024-12-18 DIAGNOSIS — J111 Influenza due to unidentified influenza virus with other respiratory manifestations: Secondary | ICD-10-CM

## 2024-12-18 MED ORDER — PROMETHAZINE-DM 6.25-15 MG/5ML PO SYRP: 5.0000 mL | ORAL_SOLUTION | Freq: Four times a day (QID) | ORAL | 0 refills | Status: AC | PRN

## 2024-12-18 MED ORDER — OSELTAMIVIR PHOSPHATE 75 MG PO CAPS: 75.0000 mg | ORAL_CAPSULE | Freq: Two times a day (BID) | ORAL | 0 refills | Status: AC

## 2024-12-18 NOTE — Progress Notes (Signed)
 " Virtual Visit Consent   Janice Barker, you are scheduled for a virtual visit with a [facility] provider today. Just as with appointments in the office, your consent must be obtained to participate. Your consent will be active for this visit and any virtual visit you may have with one of our providers in the next 365 days. If you have a MyChart account, a copy of this consent can be sent to you electronically.  As this is a virtual visit, video technology does not allow for your provider to perform a traditional examination. This may limit your provider's ability to fully assess your condition. If your provider identifies any concerns that need to be evaluated in person or the need to arrange testing (such as labs, EKG, etc.), we will make arrangements to do so. Although advances in technology are sophisticated, we cannot ensure that it will always work on either your end or our end. If the connection with a video visit is poor, the visit may have to be switched to a telephone visit. With either a video or telephone visit, we are not always able to ensure that we have a secure connection.  By engaging in this virtual visit, you consent to the provision of healthcare and authorize for your insurance to be billed (if applicable) for the services provided during this visit. Depending on your insurance coverage, you may receive a charge related to this service.  I need to obtain your verbal consent now. Are you willing to proceed with your visit today? Janice Barker has provided verbal consent on 12/18/2024 for a virtual visit (video or telephone). Loa Lamp, FNP  Date: 12/18/2024 3:08 PM   Virtual Visit via Video Note   I, Loa Lamp, connected with  Janice Barker  ([id number], 1971/06/02) on 12/18/2024 at  3:00 PM EST by a video-enabled telemedicine application and verified that I am speaking with the correct person using two identifiers.  Location: Patient: Virtual Visit Location Patient:  Home Provider: Virtual Visit Location Provider: Home Office   I discussed the limitations of evaluation and management by telemedicine and the availability of in person appointments. The patient expressed understanding and agreed to proceed.    History of Present Illness: Janice Barker is a 53 y.o. who identifies as a female who was assigned female at birth, and is being seen today for flu like sx. 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 She now has fever chills body aches and cough. Her sister has cancer in end stages. She denies wheezing and sob. In no distress. SABRA  HPI: HPI  Problems:  Patient Active Problem List   Diagnosis Date Noted   Coarctation of aorta 07/30/2022   Bicuspid aortic valve 07/30/2022    Allergies: Allergies[1] Medications: Current Medications[2]  Observations/Objective: Patient is well-developed, well-nourished in no acute distress.  Resting comfortably  at home.  Head is normocephalic, atraumatic.  No labored breathing.  Speech is clear and coherent with logical content.  Patient is alert and oriented at baseline.    Assessment and Plan: 1. Influenza (Primary)  Increase fluids humidifier at night, tylenol or ibuprofen  as directed UC as needed.   Follow Up Instructions: I discussed the assessment and treatment plan with the patient. The patient was provided an opportunity to ask questions and all were answered. The patient agreed with the plan and demonstrated an understanding of the instructions.  A copy of instructions were sent to the patient via MyChart unless otherwise noted  below.     The patient was advised to call back or seek an in-person evaluation if the symptoms worsen or if the condition fails to improve as anticipated.    Badr Piedra, FNP     [1]  Allergies Allergen Reactions   Amoxicillin -Pot Clavulanate Hives, Swelling and Other (See Comments)    Blisters in mouth   Honey Bee Venom Swelling   Pneumococcal  Vaccine Swelling   Covid-19 (Subunit) Vaccine Rash   Tape Swelling  [2]  Current Outpatient Medications:    ALPRAZolam (XANAX) 0.25 MG tablet, Take 0.25 mg by mouth at bedtime as needed for anxiety., Disp: , Rfl:    amoxicillin  (AMOXIL ) 500 MG capsule, TAKE 4 TABS 1 HOUR PRIOR TO PROCEDURE, Disp: 4 capsule, Rfl: 1   CHARLOTTE 24 FE 1-20 MG-MCG(24) CHEW, Chew 1 tablet by mouth daily., Disp: , Rfl:    cyclobenzaprine  (FLEXERIL ) 5 MG tablet, Take 1 tablet (5 mg total) by mouth 2 (two) times daily as needed for muscle spasms., Disp: 10 tablet, Rfl: 0   ibuprofen  (ADVIL ) 600 MG tablet, Take 1 tablet (600 mg total) by mouth every 6 (six) hours as needed., Disp: 30 tablet, Rfl: 0   Multiple Vitamins-Minerals (WOMENS MULTIVITAMIN PLUS PO), Take by mouth., Disp: , Rfl:    olmesartan (BENICAR) 20 MG tablet, Take 20 mg by mouth daily., Disp: , Rfl:   "

## 2024-12-18 NOTE — Patient Instructions (Signed)

## 2025-01-11 ENCOUNTER — Ambulatory Visit: Attending: Internal Medicine | Admitting: Internal Medicine

## 2025-01-11 ENCOUNTER — Encounter: Payer: Self-pay | Admitting: Internal Medicine

## 2025-01-11 VITALS — BP 118/79 | HR 94 | Ht 64.0 in | Wt 160.2 lb

## 2025-01-11 DIAGNOSIS — Q251 Coarctation of aorta: Secondary | ICD-10-CM | POA: Diagnosis not present

## 2025-01-11 DIAGNOSIS — I10 Essential (primary) hypertension: Secondary | ICD-10-CM | POA: Diagnosis not present

## 2025-01-11 DIAGNOSIS — Z8774 Personal history of (corrected) congenital malformations of heart and circulatory system: Secondary | ICD-10-CM | POA: Diagnosis not present

## 2025-01-11 NOTE — Progress Notes (Signed)
 " Cardiology Office Note:  .   Date:  01/11/2025  ID:  Janice Barker, DOB 1971-02-23, MRN [id number] PCP: Loreli Elsie JONETTA Mickey., MD  [facility] Providers Cardiologist:  Soyla DELENA Merck, MD    History of Present Illness: .   Janice Barker is a 54 y.o. female.  Discussed the use of AI scribe software for clinical note transcription with the patient, who gave verbal consent to proceed.  History of Present Illness Janice Barker is a 54 year old female with a history of coarctation repair who presents for follow-up of hypertension.  She reports improved blood pressure since her last visit, though she noted occasional low diastolic readings over the weekend. She takes olmesartan 20 mg daily and hydrochlorothiazide 25 mg daily. Her blood pressure was elevated in September through November but has improved since with addition and uptitration of hydrochlorothiazide.  A recent CT to evaluate the coarctation showed mild tapered narrowing of the aortic isthmus and proximal descending aorta without acute cardiopulmonary abnormalities. An echocardiogram was also performed demonstrating a peak gradient of about 27 mmHg, notable but unlikely t represent significant recoarctation given well controlled BP and no significant collaterals on CT, which I independently reviewed.   Her grandfather and father had hypertension. She is exposed to secondhand smoke from her mother but does not smoke herself.  She had the flu around Christmas and a busy return to work, which she feels may have reduced her hydration and affected her blood pressure.  In 2025 her LDL was 75 and hemoglobin J8r was 5.2. She does not take cholesterol medication.    ROS: negative except per HPI above.  Studies Reviewed: .        Results Labs LDL (2025): 75 Hemoglobin J8r (2025): Within normal limits Hemoglobin (2025): Within normal limits Kidney function (2025): Within normal limits Electrolytes (2025): Within normal  limits Platelet count (2025): Within normal limits  Radiology Chest CT (11/2024): Mild tapered narrowing of the aortic isthmus and proximal descending aorta; no acute cardiopulmonary abnormalities; no coronary artery calcifications; normal aortic measurements; ground glass opacities and coarse interstitial opacities; intact subclavian artery; no prosthetic or bioprosthetic material (Independently interpreted)  Diagnostic Echocardiogram (2025): Peak pressure gradient across coarctation 27 mmHg; velocity 2.5 m/s; normal left ventricular function; aortic valve function normal but morphology not clearly visualized; aorta size normal; all measurements normal Echocardiogram (2023): Velocity 2.3 m/s Risk Assessment/Calculations:       Physical Exam:   VS:  BP 118/79 (BP Location: Left Arm, Patient Position: Sitting)   Pulse 94   Ht 5' 4 (1.626 m)   Wt 160 lb 3.2 oz (72.7 kg)   SpO2 97%   BMI 27.50 kg/m    Wt Readings from Last 3 Encounters:  01/11/25 160 lb 3.2 oz (72.7 kg)  10/04/23 159 lb (72.1 kg)  07/30/22 157 lb 6.4 oz (71.4 kg)     Physical Exam GENERAL: Alert, cooperative, well developed, no acute distress. HEENT: Normocephalic, normal oropharynx, moist mucous membranes. CHEST: Clear to auscultation bilaterally, no wheezes, rhonchi, or crackles. CARDIOVASCULAR: Subtle systolic heart murmur present, normal heart rate and rhythm, S1 and S2 normal. ABDOMEN: Soft, non-tender, non-distended, without organomegaly, normal bowel sounds. EXTREMITIES: No cyanosis or edema. NEUROLOGICAL: Cranial nerves grossly intact, moves all extremities without gross motor or sensory deficit.   ASSESSMENT AND PLAN: .    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 Mild  tapered narrowing of the aortic isthmus and proximal descending segment on CT. Echocardiogram shows a 27 mmHg  pressure gradient. No acute surgical intervention required.  - Repeat echocardiogram in two years unless hypertension becomes difficult to control. - Monitor blood pressure closely.  Essential hypertension Blood pressure well-controlled with olmesartan and hydrochlorothiazide. Occasional low diastolic readings, possibly due to dehydration or increased diuretic effect. No immediate concerns unless systolic pressure drops below 90 mmHg or diastolic pressure consistently below 50 mmHg. - Continue olmesartan 20 mg daily and hydrochlorothiazide 25 mg daily - Adjust hydrochlorothiazide dose if blood pressure is low; consider reducing or skipping dose if necessary. - Ensure adequate hydration. - Monitor blood pressure regularly.  Cardiac murmur Subtle cardiac murmur detected. Previous echocardiogram inconclusive regarding aortic valve structure, I have independently reviewed the echos from 2023 and 2025. AV is likely tricuspid but I cannot be certain, it does however appear to have normal function. 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 - Repeat echocardiogram in two years to reassess aortic valve and heart function.  Recording duration: 22 minutes      Soyla Merck, MD, [facility] "

## 2025-01-11 NOTE — Patient Instructions (Addendum)
 Medication Instructions:  No Changes *If you need a refill on your cardiac medications before your next appointment, please call your pharmacy*  Lab Work: None  Follow-Up: At [facility], you and your health needs are our priority.  As part of our continuing mission to provide you with exceptional heart care, our providers are all part of one team.  This team includes your primary Cardiologist (physician) and Advanced Practice Providers or APPs (Physician Assistants and Nurse Practitioners) who all work together to provide you with the care you need, when you need it.  Your next appointment:   6 month(s) (Due July 2026)  Provider:   Gayatri A Acharya, MD or Callie Goodrich, PA, or [place], [place], or Damien Braver, NP, or [place], [place]

## 2025-01-12 ENCOUNTER — Telehealth: Admitting: Physician Assistant

## 2025-01-12 DIAGNOSIS — B9689 Other specified bacterial agents as the cause of diseases classified elsewhere: Secondary | ICD-10-CM | POA: Diagnosis not present

## 2025-01-12 DIAGNOSIS — J019 Acute sinusitis, unspecified: Secondary | ICD-10-CM

## 2025-01-12 MED ORDER — DOXYCYCLINE HYCLATE 100 MG PO TABS: 100.0000 mg | ORAL_TABLET | Freq: Two times a day (BID) | ORAL | 0 refills | Status: AC

## 2025-01-12 NOTE — Progress Notes (Signed)
 " Virtual Visit Consent   Janice Barker, you are scheduled for a virtual visit with a [facility] provider today. Just as with appointments in the office, your consent must be obtained to participate. Your consent will be active for this visit and any virtual visit you may have with one of our providers in the next 365 days. If you have a MyChart account, a copy of this consent can be sent to you electronically.  As this is a virtual visit, video technology does not allow for your provider to perform a traditional examination. This may limit your provider's ability to fully assess your condition. If your provider identifies any concerns that need to be evaluated in person or the need to arrange testing (such as labs, EKG, etc.), we will make arrangements to do so. Although advances in technology are sophisticated, we cannot ensure that it will always work on either your end or our end. If the connection with a video visit is poor, the visit may have to be switched to a telephone visit. With either a video or telephone visit, we are not always able to ensure that we have a secure connection.  By engaging in this virtual visit, you consent to the provision of healthcare and authorize for your insurance to be billed (if applicable) for the services provided during this visit. Depending on your insurance coverage, you may receive a charge related to this service.  I need to obtain your verbal consent now. Are you willing to proceed with your visit today? [name] has provided verbal consent on 01/12/2025 for a virtual visit (video or telephone). Delon CHRISTELLA Dickinson, PA-C  Date: 01/12/2025 3:57 PM   Virtual Visit via Video Note   I, Delon CHRISTELLA Dickinson, connected with  Janice Barker  ([id number], November 04, 1971) on 01/12/2025 at  3:45 PM EST by a video-enabled telemedicine application and verified that I am speaking with the correct person using two identifiers.  Location: Patient: Virtual Visit Location  Patient: Home Provider: Virtual Visit Location Provider: Home Office   I discussed the limitations of evaluation and management by telemedicine and the availability of in person appointments. The patient expressed understanding and agreed to proceed.    History of Present Illness: Janice Barker is a 54 y.o. who identifies as a female who was assigned female at birth, and is being seen today for sinus pain.  HPI: Sinusitis This is a new problem. The current episode started 1 to 4 weeks ago (Was just treated with Tamiflu  for influenza on 12/18/24; had some improvements following, but within a week had sinus symptoms set in and not improve). The problem has been gradually worsening since onset. There has been no fever. The pain is moderate. Associated symptoms include congestion, coughing, ear pain (increase pressure and popping), headaches, sinus pressure (worse in right maxillary) and a sore throat (from post nasal drainage). Pertinent negatives include no chills, diaphoresis, hoarse voice or shortness of breath. Past treatments include saline nose sprays (allegra, flonase). The treatment provided no relief.     Problems:  Patient Active Problem List   Diagnosis Date Noted   Coarctation of aorta 07/30/2022   Bicuspid aortic valve 07/30/2022    Allergies: Allergies[1] Medications: Current Medications[2]  Observations/Objective: Patient is well-developed, well-nourished in no acute distress.  Resting comfortably at home.  Head is normocephalic, atraumatic.  No labored breathing.  Speech is clear and coherent with logical content.  Patient is alert and oriented at baseline.    Assessment and Plan:  1. Acute bacterial sinusitis (Primary) - doxycycline  (VIBRA -TABS) 100 MG tablet; Take 1 tablet (100 mg total) by mouth 2 (two) times daily.  Dispense: 14 tablet; Refill: 0  - Worsening symptoms that have not responded to OTC medications.  - Will give Doxycycline  - Continue allergy  medications.  - Steam and humidifier can help - Stay well hydrated and get plenty of rest.  - Seek in person evaluation if no symptom improvement or if symptoms worsen   Follow Up Instructions: I discussed the assessment and treatment plan with the patient. The patient was provided an opportunity to ask questions and all were answered. The patient agreed with the plan and demonstrated an understanding of the instructions.  A copy of instructions were sent to the patient via MyChart unless otherwise noted below.    The patient was advised to call back or seek an in-person evaluation if the symptoms worsen or if the condition fails to improve as anticipated.    Delon CHRISTELLA Dickinson, PA-C     [1]  Allergies Allergen Reactions   Amoxicillin -Pot Clavulanate Hives, Swelling and Other (See Comments)    Blisters in mouth   Honey Bee Venom Swelling   Pneumococcal Vaccine Swelling   Covid-19 (Subunit) Vaccine Rash   Tape Swelling  [2]  Current Outpatient Medications:    doxycycline  (VIBRA -TABS) 100 MG tablet, Take 1 tablet (100 mg total) by mouth 2 (two) times daily., Disp: 14 tablet, Rfl: 0   ALPRAZolam (XANAX) 0.25 MG tablet, Take 0.25 mg by mouth at bedtime as needed for anxiety., Disp: , Rfl:    amoxicillin  (AMOXIL ) 500 MG capsule, TAKE 4 TABS 1 HOUR PRIOR TO PROCEDURE, Disp: 4 capsule, Rfl: 1   CHARLOTTE 24 FE 1-20 MG-MCG(24) CHEW, Chew 1 tablet by mouth daily., Disp: , Rfl:    cyclobenzaprine  (FLEXERIL ) 5 MG tablet, Take 1 tablet (5 mg total) by mouth 2 (two) times daily as needed for muscle spasms. (Patient not taking: Reported on 01/11/2025), Disp: 10 tablet, Rfl: 0   hydrochlorothiazide (HYDRODIURIL) 25 MG tablet, Take 25 mg by mouth daily., Disp: , Rfl:    ibuprofen  (ADVIL ) 600 MG tablet, Take 1 tablet (600 mg total) by mouth every 6 (six) hours as needed. (Patient not taking: Reported on 01/11/2025), Disp: 30 tablet, Rfl: 0   Multiple Vitamins-Minerals (WOMENS MULTIVITAMIN PLUS PO),  Take by mouth., Disp: , Rfl:    olmesartan (BENICAR) 20 MG tablet, Take 20 mg by mouth daily., Disp: , Rfl:   "

## 2025-01-12 NOTE — Patient Instructions (Signed)
 " Janice Barker, thank you for joining Delon CHRISTELLA Dickinson, PA-C for today's virtual visit.  While this provider is not your primary care provider (PCP), if your PCP is located in our provider database this encounter information will be shared with them immediately following your visit.   A [facility] MyChart account gives you access to today's visit and all your visits, tests, and labs performed at [facility]  click here if you don't have a [facility] MyChart account or go to [email or web address]  Consent: (Patient) Janice Barker provided verbal consent for this virtual visit at the beginning of the encounter.  Current Medications:  Current Outpatient Medications:    doxycycline  (VIBRA -TABS) 100 MG tablet, Take 1 tablet (100 mg total) by mouth 2 (two) times daily., Disp: 14 tablet, Rfl: 0   ALPRAZolam (XANAX) 0.25 MG tablet, Take 0.25 mg by mouth at bedtime as needed for anxiety., Disp: , Rfl:    amoxicillin  (AMOXIL ) 500 MG capsule, TAKE 4 TABS 1 HOUR PRIOR TO PROCEDURE, Disp: 4 capsule, Rfl: 1   CHARLOTTE 24 FE 1-20 MG-MCG(24) CHEW, Chew 1 tablet by mouth daily., Disp: , Rfl:    cyclobenzaprine  (FLEXERIL ) 5 MG tablet, Take 1 tablet (5 mg total) by mouth 2 (two) times daily as needed for muscle spasms. (Patient not taking: Reported on 01/11/2025), Disp: 10 tablet, Rfl: 0   hydrochlorothiazide (HYDRODIURIL) 25 MG tablet, Take 25 mg by mouth daily., Disp: , Rfl:    ibuprofen  (ADVIL ) 600 MG tablet, Take 1 tablet (600 mg total) by mouth every 6 (six) hours as needed. (Patient not taking: Reported on 01/11/2025), Disp: 30 tablet, Rfl: 0   Multiple Vitamins-Minerals (WOMENS MULTIVITAMIN PLUS PO), Take by mouth., Disp: , Rfl:    olmesartan (BENICAR) 20 MG tablet, Take 20 mg by mouth daily., Disp: , Rfl:    Medications ordered in this encounter:  Meds ordered this encounter  Medications   doxycycline  (VIBRA -TABS) 100 MG tablet    Sig: Take 1 tablet (100 mg total) by mouth 2 (two)  times daily.    Dispense:  14 tablet    Refill:  0    Supervising Provider:   BLAISE ALEENE KIDD [[id number]]     *If you need refills on other medications prior to your next appointment, please contact your pharmacy*  Follow-Up: Call back or seek an in-person evaluation if the symptoms worsen or if the condition fails to improve as anticipated.  [facility] [phone]  Other Instructions Sinus Infection, Adult A sinus infection, also called sinusitis, is inflammation of your sinuses. Sinuses are hollow spaces in the bones around your face. Your sinuses are located: Around your eyes. In the middle of your forehead. Behind your nose. In your cheekbones. Mucus normally drains out of your sinuses. When your nasal tissues become inflamed or swollen, mucus can become trapped or blocked. This allows bacteria, viruses, and fungi to grow, which leads to infection. Most infections of the sinuses are caused by a virus. A sinus infection can develop quickly. It can last for up to 4 weeks (acute) or for more than 12 weeks (chronic). A sinus infection often develops after a cold. What are the causes? This condition is caused by anything that creates swelling in the sinuses or stops mucus from draining. This includes: Allergies. Asthma. Infection from bacteria or viruses. Deformities or blockages in your nose or sinuses. Abnormal growths in the nose (nasal polyps). Pollutants, such as chemicals or irritants in the air. Infection from fungi.  This is rare. What increases the risk? You are more likely to develop this condition if you: Have a weak body defense system (immune system). Do a lot of swimming or diving. Overuse nasal sprays. Smoke. What are the signs or symptoms? The main symptoms of this condition are pain and a feeling of pressure around the affected sinuses. Other symptoms include: Stuffy nose or congestion that makes it difficult to breathe through your nose. Thick  yellow or greenish drainage from your nose. Tenderness, swelling, and warmth over the affected sinuses. A cough that may get worse at night. Decreased sense of smell and taste. Extra mucus that collects in the throat or the back of the nose (postnasal drip) causing a sore throat or bad breath. Tiredness (fatigue). Fever. How is this diagnosed? This condition is diagnosed based on: Your symptoms. Your medical history. A physical exam. Tests to find out if your condition is acute or chronic. This may include: Checking your nose for nasal polyps. Viewing your sinuses using a device that has a light (endoscope). Testing for allergies or bacteria. Imaging tests, such as an MRI or CT scan. In rare cases, a bone biopsy may be done to rule out more serious types of fungal sinus disease. How is this treated? Treatment for a sinus infection depends on the cause and whether your condition is chronic or acute. If caused by a virus, your symptoms should go away on their own within 10 days. You may be given medicines to relieve symptoms. They include: Medicines that shrink swollen nasal passages (decongestants). A spray that eases inflammation of the nostrils (topical intranasal corticosteroids). Rinses that help get rid of thick mucus in your nose (nasal saline washes). Medicines that treat allergies (antihistamines). Over-the-counter pain relievers. If caused by bacteria, your health care provider may recommend waiting to see if your symptoms improve. Most bacterial infections will get better without antibiotic medicine. You may be given antibiotics if you have: A severe infection. A weak immune system. If caused by narrow nasal passages or nasal polyps, surgery may be needed. Follow these instructions at home: Medicines Take, use, or apply over-the-counter and prescription medicines only as told by your health care provider. These may include nasal sprays. If you were prescribed an antibiotic  medicine, take it as told by your health care provider. Do not stop taking the antibiotic even if you start to feel better. Hydrate and humidify  Drink enough fluid to keep your urine pale yellow. Staying hydrated will help to thin your mucus. Use a cool mist humidifier to keep the humidity level in your home above 50%. Inhale steam for 10-15 minutes, 3-4 times a day, or as told by your health care provider. You can do this in the bathroom while a hot shower is running. Limit your exposure to cool or dry air. Rest Rest as much as possible. Sleep with your head raised (elevated). Make sure you get enough sleep each night. General instructions  Apply a warm, moist washcloth to your face 3-4 times a day or as told by your health care provider. This will help with discomfort. Use nasal saline washes as often as told by your health care provider. Wash your hands often with soap and water to reduce your exposure to germs. If soap and water are not available, use hand sanitizer. Do not smoke. Avoid being around people who are smoking (secondhand smoke). Keep all follow-up visits. This is important. Contact a health care provider if: You have a fever. Your  symptoms get worse. Your symptoms do not improve within 10 days. Get help right away if: You have a severe headache. You have persistent vomiting. You have severe pain or swelling around your face or eyes. You have vision problems. You develop confusion. Your neck is stiff. You have trouble breathing. These symptoms may be an emergency. Get help right away. Call 911. Do not wait to see if the symptoms will go away. Do not drive yourself to the hospital. Summary A sinus infection is soreness and inflammation of your sinuses. Sinuses are hollow spaces in the bones around your face. This condition is caused by nasal tissues that become inflamed or swollen. The swelling traps or blocks the flow of mucus. This allows bacteria, viruses, and  fungi to grow, which leads to infection. If you were prescribed an antibiotic medicine, take it as told by your health care provider. Do not stop taking the antibiotic even if you start to feel better. Keep all follow-up visits. This is important. This information is not intended to replace advice given to you by your health care provider. Make sure you discuss any questions you have with your health care provider. Document Revised: 11/21/2021 Document Reviewed: 11/21/2021 Elsevier Patient Education  2024 [employer]vier [employer].   If you have been instructed to have an in-person evaluation today at a local Urgent Care facility, please use the link below. It will take you to a list of all of our available [facility], including address, phone number and hours of operation. Please do not delay care.  [facility]  If you or a family member do not have a primary care provider, use the link below to schedule a visit and establish care. When you choose a [facility] primary care physician or advanced practice provider, you gain a long-term partner in health. Find a Primary Care Provider  Learn more about [facility]'s in-office and virtual care options: [place] - Get Care Now "
